# Patient Record
Sex: Male | Born: 1980 | Race: White | Hispanic: No | Marital: Married | Smoking: Former smoker
Health system: Southern US, Community
[De-identification: ages and names within clinical notes are randomized; demographics above are authoritative.]

---

## 2004-11-16 ENCOUNTER — Emergency Department: Payer: Self-pay | Admitting: Emergency Medicine

## 2007-03-14 ENCOUNTER — Emergency Department: Payer: Self-pay

## 2007-05-22 ENCOUNTER — Emergency Department: Payer: Self-pay | Admitting: Emergency Medicine

## 2008-02-12 ENCOUNTER — Emergency Department: Payer: Self-pay | Admitting: Emergency Medicine

## 2008-11-10 ENCOUNTER — Emergency Department: Payer: Self-pay | Admitting: Emergency Medicine

## 2009-08-05 ENCOUNTER — Emergency Department: Payer: Self-pay | Admitting: Emergency Medicine

## 2009-11-25 ENCOUNTER — Ambulatory Visit: Payer: Self-pay | Admitting: Family Medicine

## 2010-05-21 ENCOUNTER — Emergency Department: Payer: Self-pay | Admitting: Emergency Medicine

## 2010-05-22 ENCOUNTER — Emergency Department: Payer: Self-pay | Admitting: Emergency Medicine

## 2011-04-17 ENCOUNTER — Emergency Department: Payer: Self-pay | Admitting: Emergency Medicine

## 2011-08-26 IMAGING — CT CT ABD-PELV W/ CM
1 of 2 series · 15 of 32 positions shown, 19 images · non-contrast
Comparison: none

REASON FOR EXAM: (1) epigastric pain ? early a/p discussed w Dr. MINAYO.; (2)
epigastric pain, discuss
COMMENTS:   May transport without cardiac monitor

[Series 2: soft tissue · axial · 0.68mm/px · z∈[-1210,-790]mm · 15 of 154 slices shown, 19 images]
[im 7/154  soft-tissue]
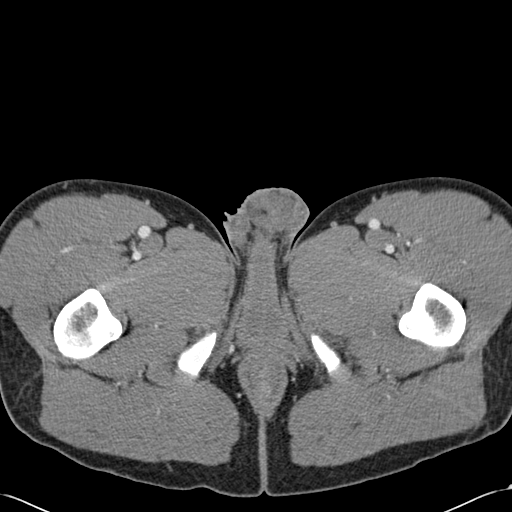
[im 7/154  bone]
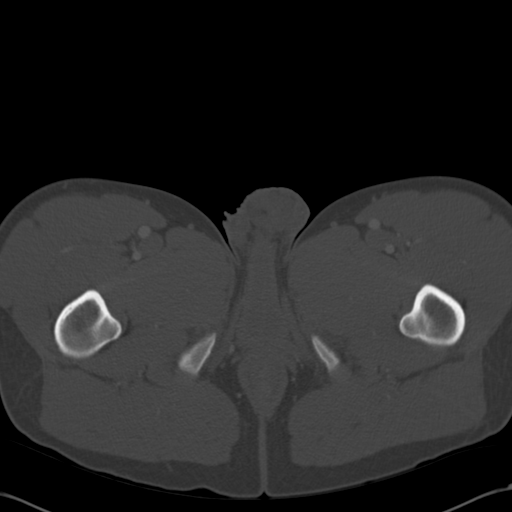
[im 19/154  soft-tissue]
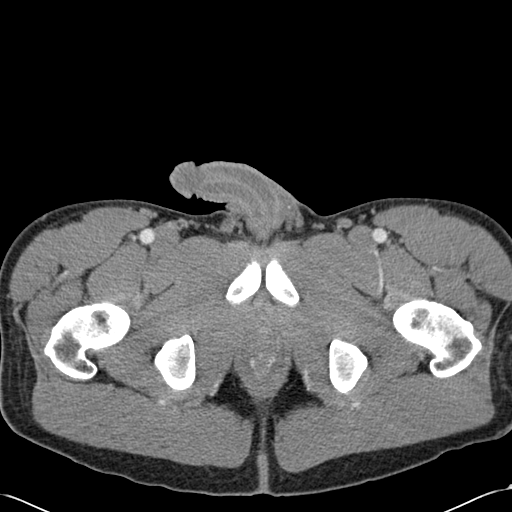
[im 31/154  soft-tissue]
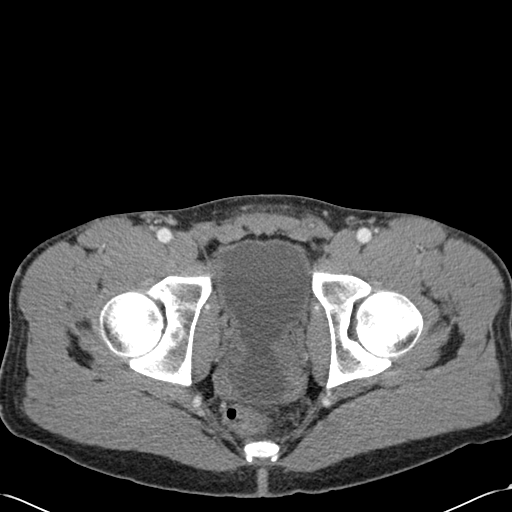
[im 43/154  soft-tissue]
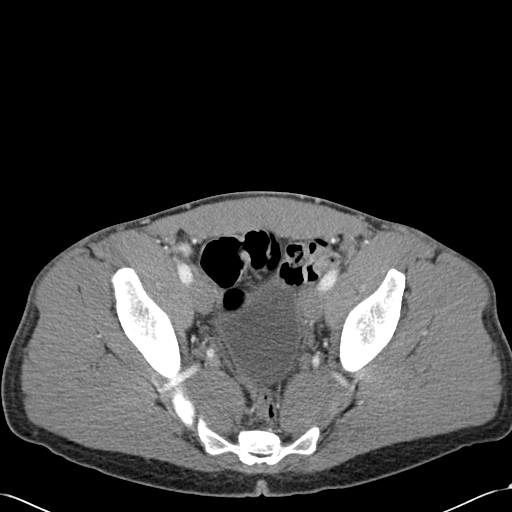
[im 56/154  soft-tissue]
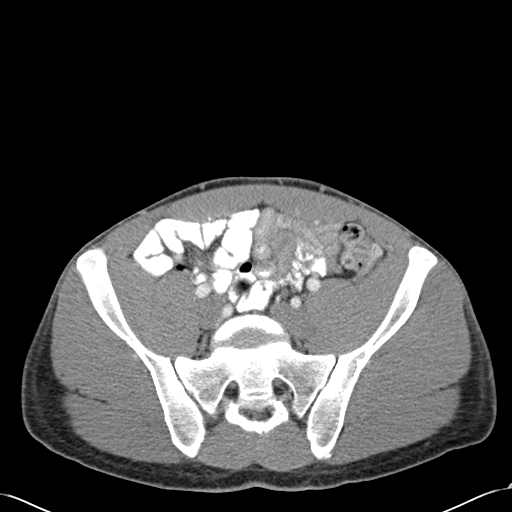
[im 68/154  soft-tissue]
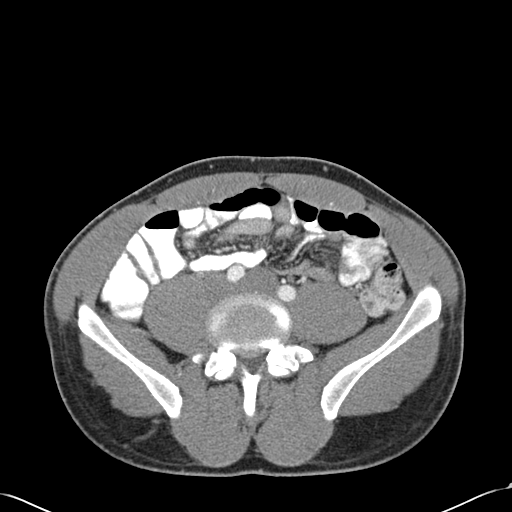
[im 80/154  soft-tissue]
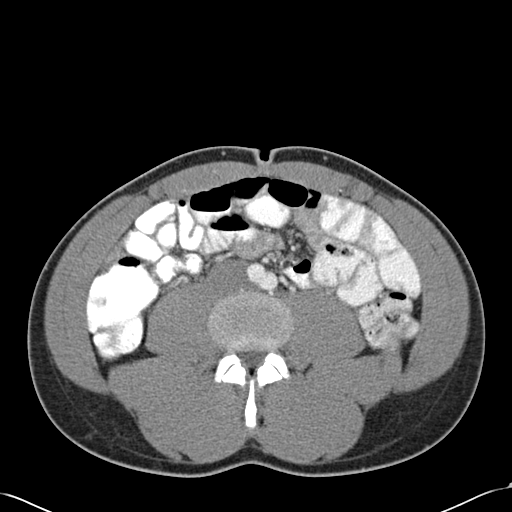
[im 86/154  soft-tissue]
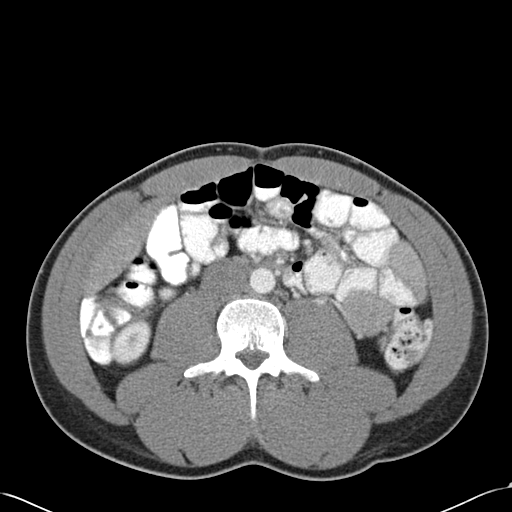
[im 98/154  soft-tissue]
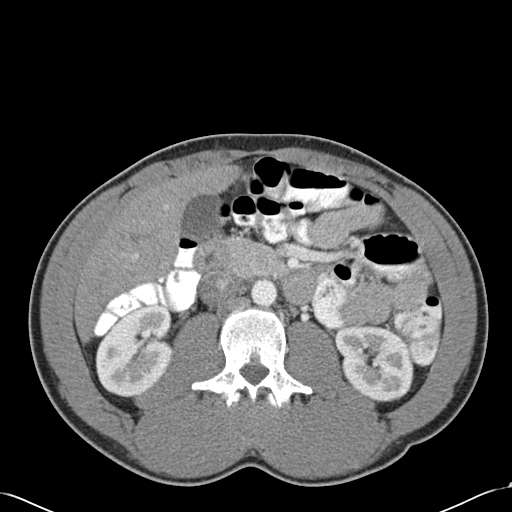
[im 98/154  bone]
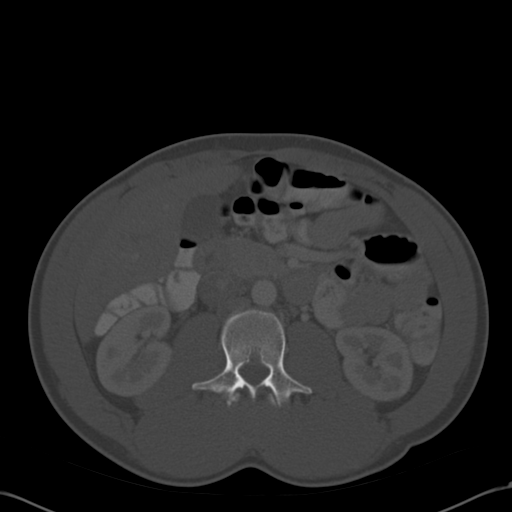
[im 111/154  soft-tissue]
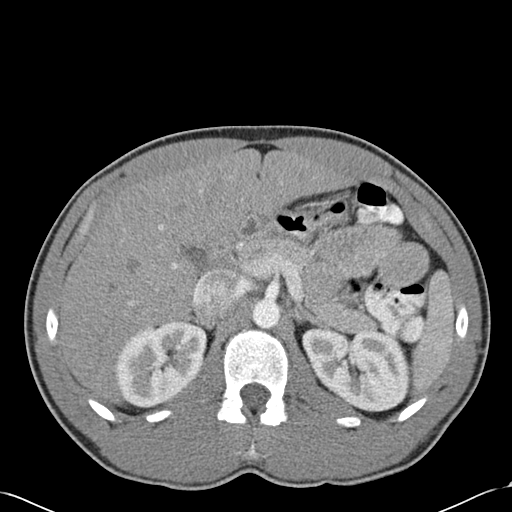
[im 123/154  soft-tissue]
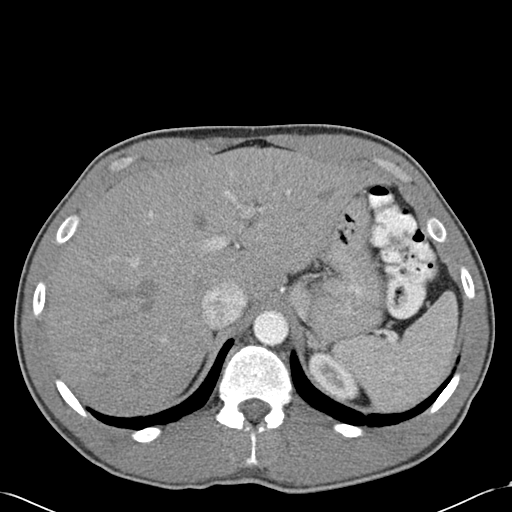
[im 129/154  lung]
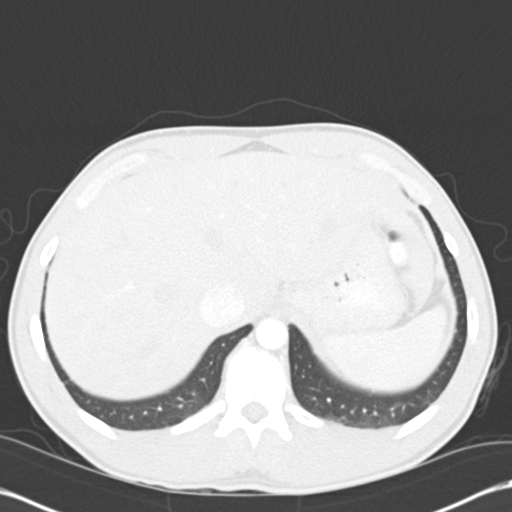
[im 135/154  soft-tissue]
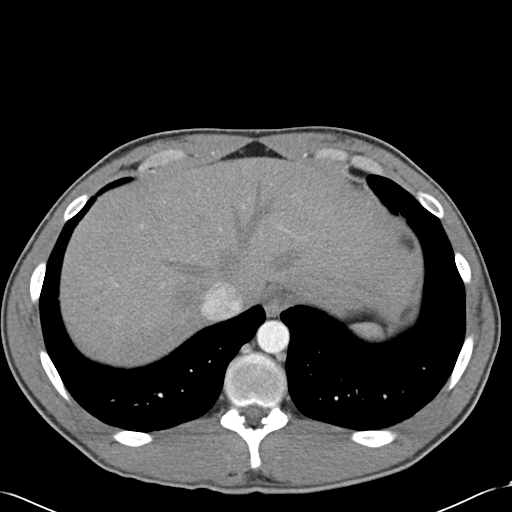
[im 135/154  lung]
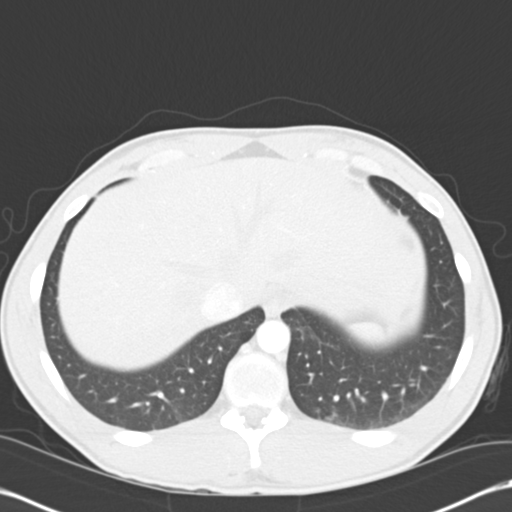
[im 141/154  lung]
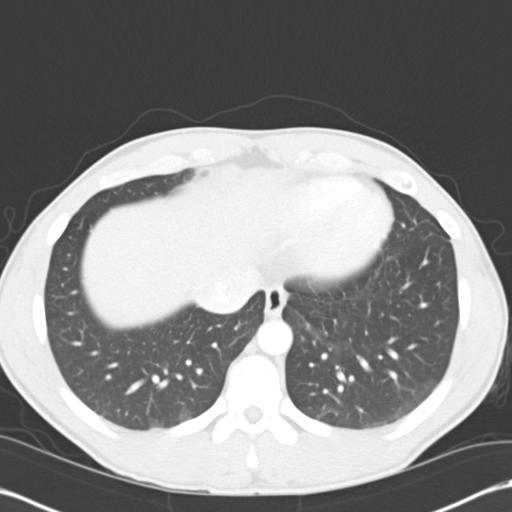
[im 147/154  soft-tissue]
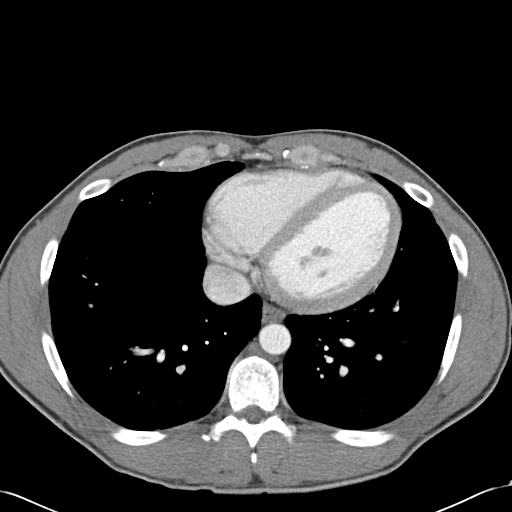
[im 147/154  lung]
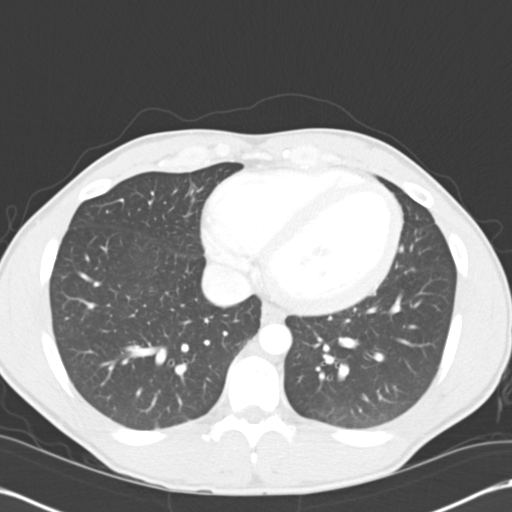

[15 of 32 positions shown; findings below may reference images not displayed]

PROCEDURE:     CT  - CT ABDOMEN / PELVIS  W  - May 21, 2010 [DATE]

RESULT:     CT of the abdomen and pelvis is performed utilizing 100 mL of
2sovue-6MP iodinated intravenous contrast and oral contrast. Images are
reconstructed in the axial plane at 3 mm slice thickness increments. The
patient has no previous CT of the abdomen and pelvis.

Images through the base of the lungs demonstrate normal-appearing aeration
with minimal dependent atelectasis present.

The liver, spleen, adrenal glands, gallbladder, pancreas, aorta, kidneys and
urinary bladder appear unremarkable. There is no evidence of abnormal bowel
distention or bowel wall thickening. The appendix is seen and contains air
and oral contrast and appears normal. There is no abscess evident. There is
no evidence of adenopathy.
IMPRESSION: Unremarkable CT of the abdomen and pelvis.

## 2011-08-27 IMAGING — CR DG ABDOMEN 3V
1 series · 5 of 5 positions shown · non-contrast
Comparison: none

REASON FOR EXAM: Pain
COMMENTS:   May transport without cardiac monitor

PROCEDURE:     DXR - DXR ABDOMEN 3-WAY (INCL PA CXR)  - May 22, 2010  [DATE]
RESULT:     Comparison is made to the study of 02/12/2008.
The lungs are clear. There is contrast through the colon to the rectum.
There is no abnormal bowel distention or free air. There is no abnormal
calcification.

[Series 1: view not recorded · 0.17mm/px · 5 of 5 slices shown]
[im 1/5]
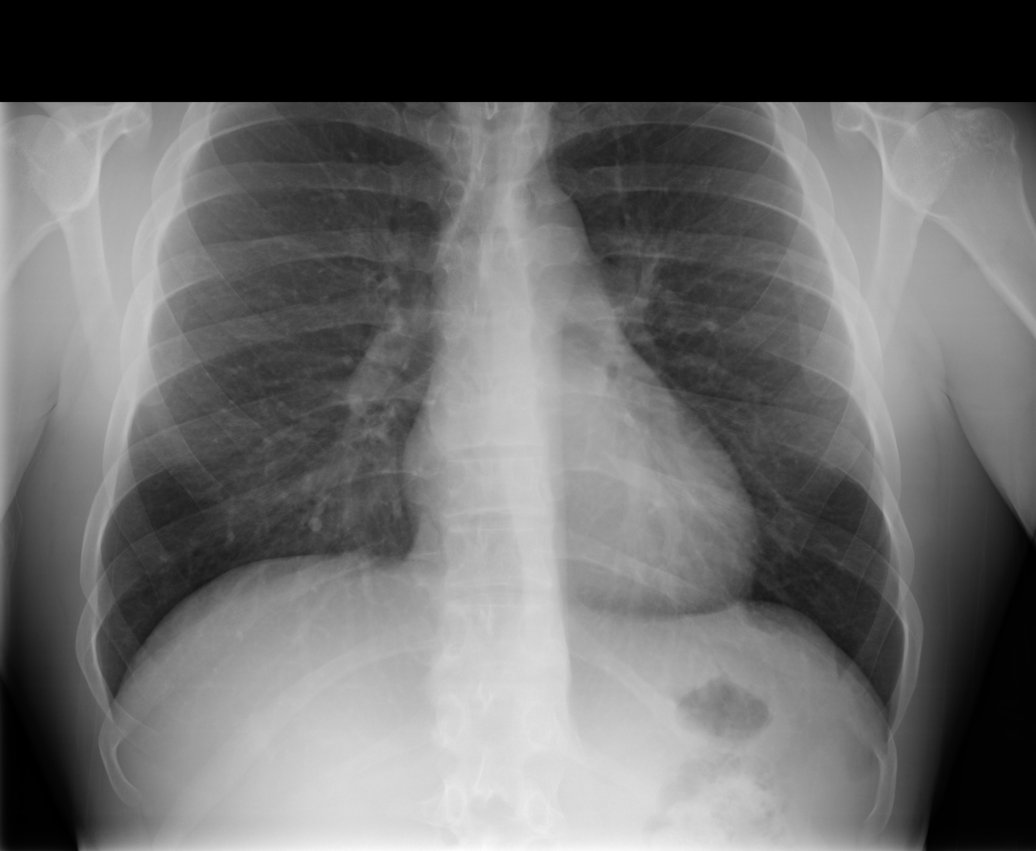
[im 2/5]
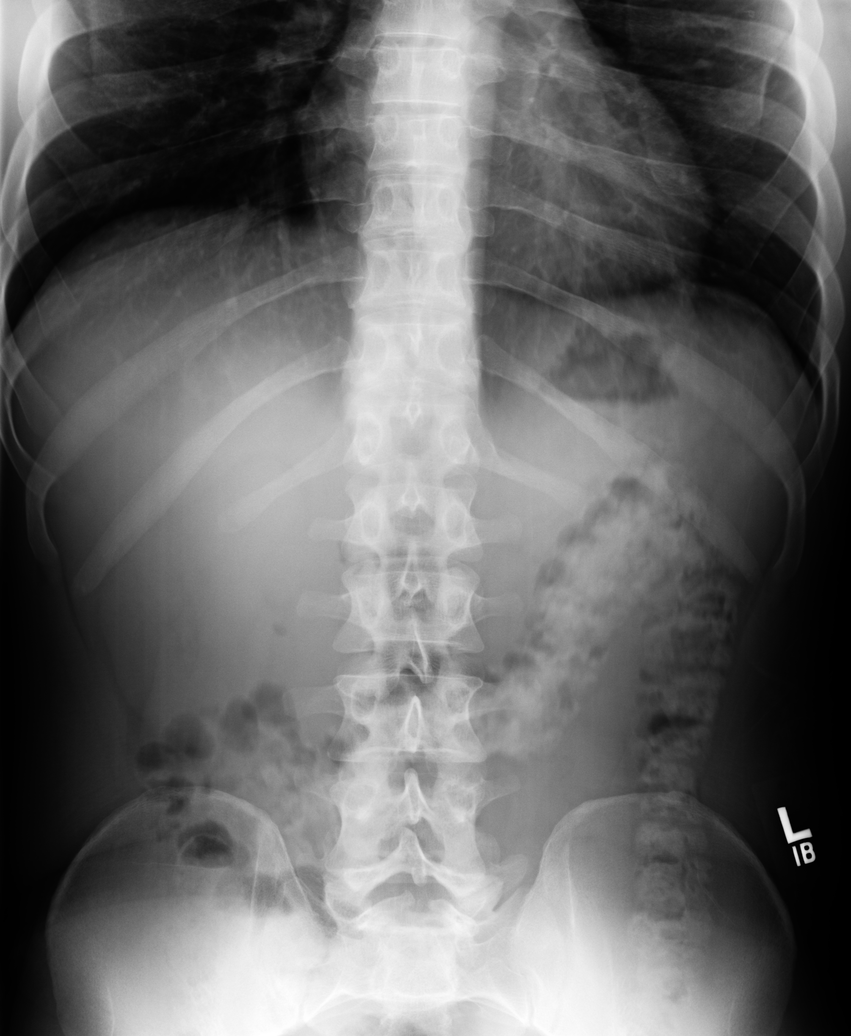
[im 3/5]
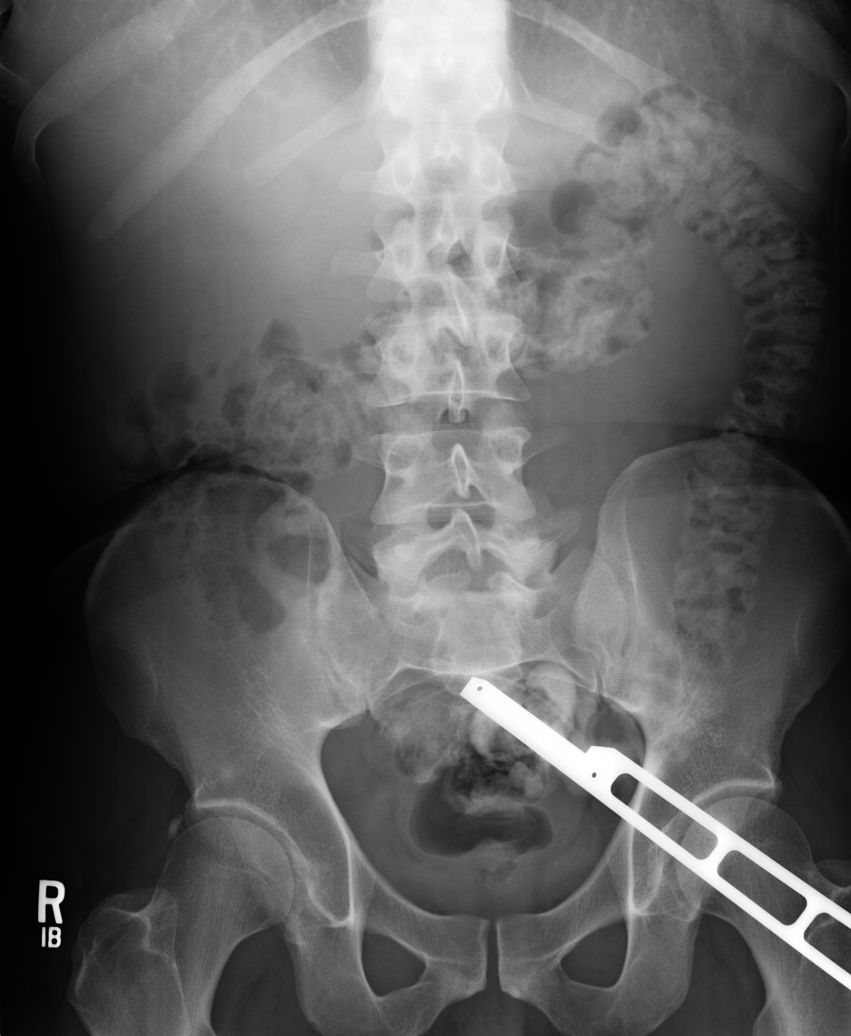
[im 4/5]
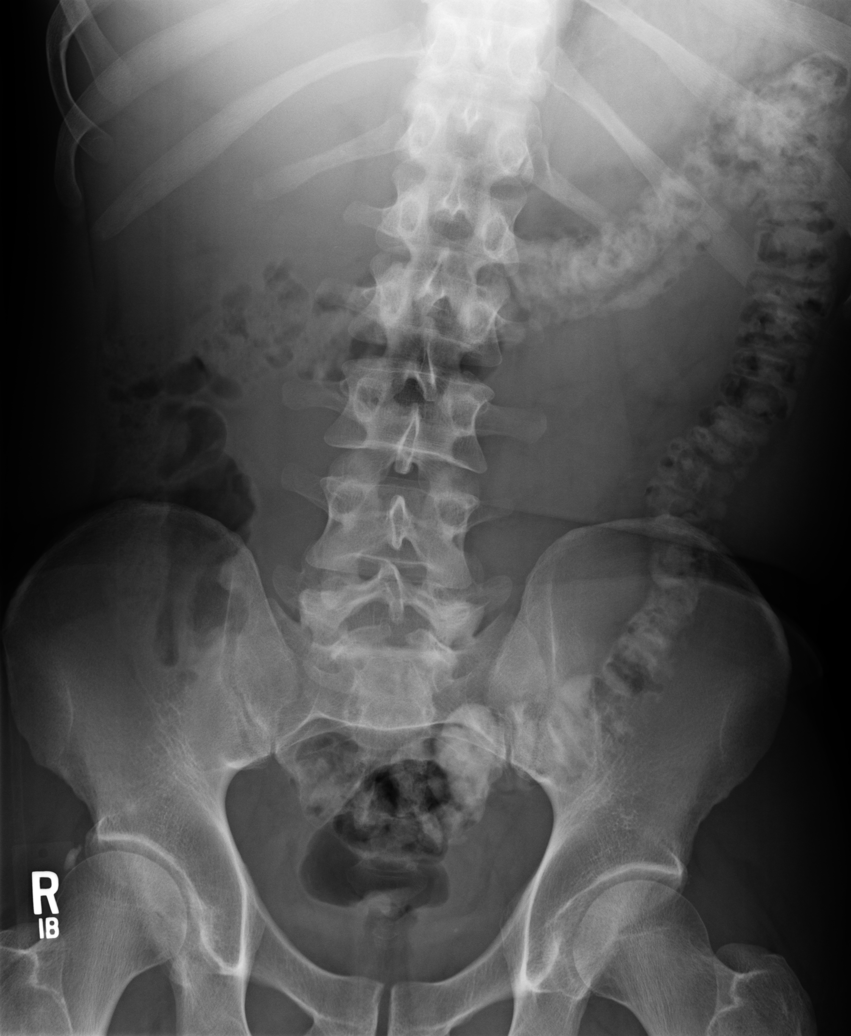
[im 5/5]
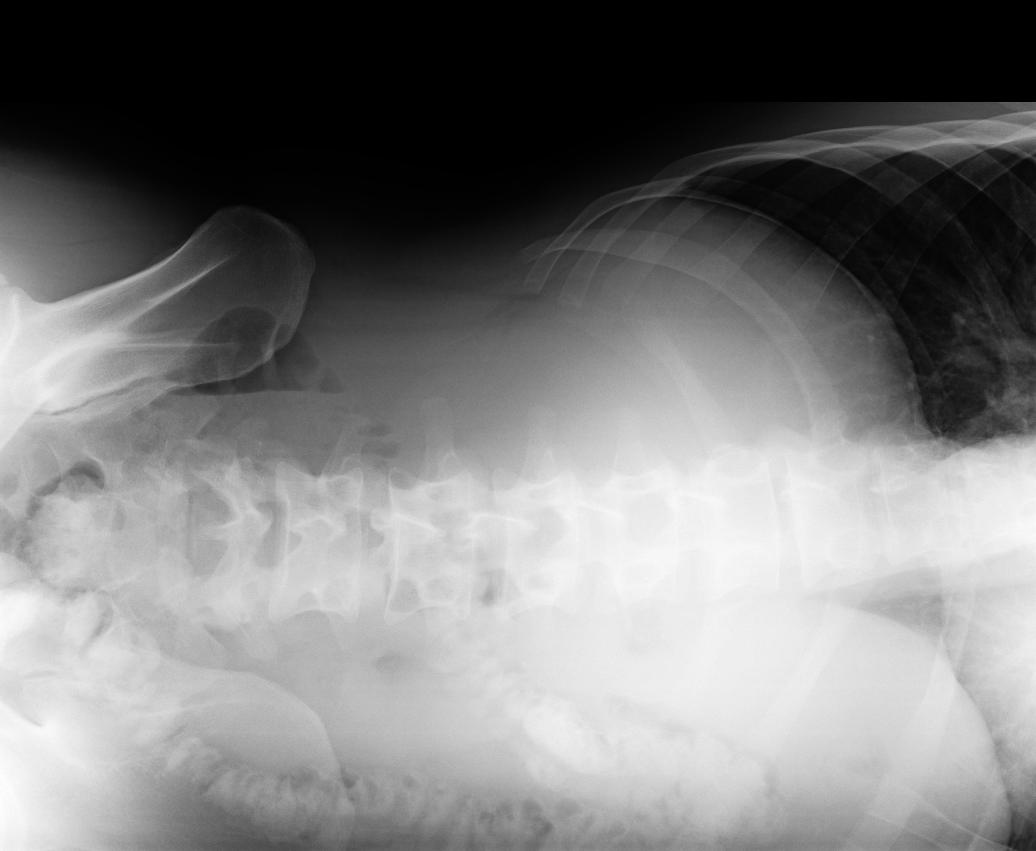

[5 of 5 positions shown; findings below may reference images not displayed]

IMPRESSION: Contrast present from the recent CT. No evidence of
obstruction or perforation.

## 2020-04-10 ENCOUNTER — Ambulatory Visit: Payer: BC Managed Care – PPO | Attending: Sports Medicine | Admitting: Physical Therapy

## 2020-04-10 ENCOUNTER — Other Ambulatory Visit: Payer: Self-pay

## 2020-04-10 ENCOUNTER — Encounter: Payer: Self-pay | Admitting: Physical Therapy

## 2020-04-10 DIAGNOSIS — M545 Low back pain: Secondary | ICD-10-CM | POA: Diagnosis not present

## 2020-04-10 DIAGNOSIS — M25551 Pain in right hip: Secondary | ICD-10-CM | POA: Diagnosis present

## 2020-04-10 DIAGNOSIS — M25552 Pain in left hip: Secondary | ICD-10-CM | POA: Diagnosis present

## 2020-04-10 DIAGNOSIS — G8929 Other chronic pain: Secondary | ICD-10-CM | POA: Diagnosis present

## 2020-04-11 ENCOUNTER — Encounter: Payer: Self-pay | Admitting: Physical Therapy

## 2020-04-11 NOTE — Therapy (Signed)
[facility] [address] [place], [place], [address] Phone: [phone]   Fax:  [phone]  Physical Therapy Evaluation  Patient Details  Name: Arthur Gonzalez MRN: [id number] Date of Birth: April 08, 1981 Referring Provider (PT): Dr. Landry Mellow   Encounter Date: 04/10/2020   PT End of Session - 04/11/20 0732    Visit Number 1    Number of Visits 9    Date for PT Re-Evaluation 05/08/20    Authorization - Visit Number 1    Authorization - Number of Visits 10    PT Start Time 1428    PT Stop Time 1536    PT Time Calculation (min) 68 min    Activity Tolerance Patient limited by pain    Behavior During Therapy [facility] for tasks assessed/performed           History reviewed. No pertinent past medical history.  History reviewed. No pertinent surgical history.  There were no vitals filed for this visit.    Subjective Assessment - 04/10/20 1604    Subjective Pt is a 39 y.o. male referred to PT for 2 year history of chronic LBP that is mid line. Pt denies N/t down BLE's. Pt reports pain is best in the morning when he wakes up at a 2-3 NPS. After long day at work pt reports 8-10/10 pain NPS. Pt has a history of manual labor jobs, being in [employer] and is currently in a very labor intensive job working for an [employer]. Pt has the most pain with rotation to the R/L when on a ladder but reports having to crawl under vehicles also on a daily basis. Pt reports a pulsing, dull achey pain with sitting in his low back that is intermittent. Pt also reports having hip pain R> L that referred to R testicle but reports in the last 4 days he is now having the same L hip pain that is referring to his L testicle now. Pt does report recent difficulty within the last 4 days having a need to take a bowel movement but has not been able. Also report frequent urination but no increase in fluid intake. Pt is tired of having LBP as it has become interferring with his  family life and performance of job. His goal with PT is to reduce pain and prevent a surgery and be able to perform work duties.    Limitations Sitting;Lifting;Standing;Walking    Patient Stated Goals Prevent surgery. Improve pain. Ability to keep working at current job.    Currently in Pain? Yes    Pain Score 6     Pain Location Back    Pain Orientation Right;Left;Medial    Pain Descriptors / Indicators Aching;Discomfort;Throbbing    Pain Type Chronic pain    Pain Radiating Towards B hips to groin. Into testicles.    Pain Onset More than a month ago    Pain Frequency Intermittent    Aggravating Factors  R/L rotation, prone extension, R side bending, R hip flexion    Pain Relieving Factors N/A. Standing lumbar extensions.    Effect of Pain on Daily Activities Affecting ability to work. Interfering with family life.    Multiple Pain Sites Yes    Pain Location Hip    Pain Orientation Right;Left    Pain Descriptors / Indicators Aching;Dull    Pain Type Chronic pain    Pain Onset More than a month ago  [facility] PT Assessment - 04/11/20 0001      Assessment   Medical Diagnosis Chronic low back pain and hip pain    Referring Provider (PT) Dr. Landry Mellow    Onset Date/Surgical Date 08/25/19    Hand Dominance Right    Prior Therapy Yes      Home Environment   Living Environment Private residence    Living Arrangements Spouse/significant other;Children      Prior Function   Level of Independence Independent      Cognition   Overall Cognitive Status Within Functional Limits for tasks assessed           OBJECTIVE  Mental Status Patient's fund of knowledge is within normal limits for educational level.  SENSATION: Grossly intact to light touch bilateral LEs as determined by testing dermatomes L2-S2 Proprioception and hot/cold testing deferred on this date   MUSCULOSKELETAL: Tremor: None Bulk: Normal Tone: Normal No visible step-off along spinal  column  Posture Lumbar lordosis: WNL Lumbar lateral shift: negative  Gait Antalgic gait on R.   Palpation TTP to B SIJ L>R. TTP to midline lumbar spine and B paraspinals. TTP B glut med musculature and R thoracic paraspinals at level of inferior angle of scapula.  Strength (out of 5) R/L 5*/5 Hip flexion 5/5 Hip abduction (Seated) 5/5 Hip adduction 5/5 Knee extension 5/5 Knee flexion 5/5 Ankle dorsiflexion 5*/5 Ankle plantarflexion  *Indicates pain   AROM (degrees) R/L (all movements include overpressure unless otherwise stated) Lumbar forward flexion (65): WFL Lumbar extension (30): WFL Lumbar lateral flexion (25): R: WFL L: WFL* Thoracic and Lumbar rotation (30 degrees):  R: WFL* L: WFL Hip Flexion (0-125): WFL *Indicates pain    Muscle Length Hamstrings: R: 90 degrees L: 90 degrees  [place]: WFL Bilaterally    Passive Accessory Intervertebral Motion (PAIVM) Pt reports concordant pain with CPA's from L1-L5. Hypomobile throughout. Thoracic CPA's T12-T1 hypomobile throughout. PA sacral mobs no increase in pain.   SPECIAL TESTS Lumbar Radiculopathy and Discogenic: Slump (SN 83, -LR 0.32): R: NEGATIVE L: NEGATIVE SLR (SN 92, -LR 0.29): R: NEGATIVE L:  NEGATIVE  (+) FADIR on R  SIJ:  TTP on B SIJ.  Sacral thrust: NEGATIVE Sacral Distraction: NEGATIVE Sacral compression: NEGATIVE  FOTO score: 53/69  Clinical Prediction Rule for Compression Fractures: 1. >52 y/o 2. no presence of leg pain 3. BMI <22 4. client not regularly exercising 5. male <1 of 5 = SN 97, - LR 0.16 >4 of 5 = SP 96, + LR 9.6  Clinical Prediction Rule for Spinal Stenosis: 1. Bilateral symptoms 2. Leg pain > back pain 3. Pain during walking or standing 4. Pain relief when sitting 5. Age >17 y/o <1 (+) finding = rule out stenosis (SN 96) 4 of 5 (+) findings = rule in stenosis (SP 98)  Clinical Prediction Rule for Facet Joint Syndrome (check pg 460): 1. Age > 43 y/o 2. Symptoms  best when walking 3. Symptoms best when sitting 4. Onset of pain is paraspinal 5. (+) lumbar extension-rotation test >3 (+) findings = SP 91, +LR 9.7 <2 (+) findings = SN 100  Pain Provocation Cluster for SIJ Dysfunction Thigh Thrust Test (SN 88, -LR 0.18) Gaenslen's Test Distraction Test Compression Test Sacral Thrust Test  3 (+) tests: if discogenic pain has been ruled out through repeated extensions; SN 94, -LR 0.80  Rule out Hip OA (SN 86) Hip pain Hip IR <15 degrees morning stiffness < 60 min > 50 y/o    Neurogenic  vs Vascular Claudication (Rieman, 2016)  Description Neurogenic Vascular  Quality of pain cramping Burning, cramping  LBP Frequently present Absent   Sensory symptoms Frequently present Absent  Muscle weakness Frequently present Absent  Reflex changes Frequently present Absent  Bicycle test Symptoms only when sitting upright Symptoms not position dependent  Arterial pulses Normal  Decreased or absent  Skin/dystrophic changes Absent  Frequently present  Aggravating factors Upright posture, extension of trunk Any leg activity  Relieving factors Sitting, bending forward Rest, no leg activity  Walking uphill Symptoms produced later Not position dependent  Walking downhill Symptoms produced earlier Not position dependent    Lumbar Spinal Stenosis vs Radiculopathy due to Disc Herniation (Rieman, 2016)  Description Spinal Stenosis Disc Herniation  Age Usually > 38 y/o Usually < 38 y/o  Onset Usually more insidious Usually more sudden  Position change: flexion Better  Worse  Position change: extension Worse Better  Focal muscle weakness Less common Can be common  Dural tension Less common Common  UMN or LMN involvement  Central stenosis: UMN Lateral foraminal stenosis: LMN LMN    Objective measurements completed on examination: See above findings.      PT Education - 04/11/20 0731    Education Details Monitor bowel and bladder symptoms. With worsening  symptoms, and increased pulsating in low back, to call doctor.  See HEP.    Person(s) Educated Patient    Methods Explanation;Tactile cues;Verbal cues;Handout    Comprehension Verbalized understanding;Returned demonstration               PT Long Term Goals - 04/11/20 0805      PT LONG TERM GOAL #1   Title Pt will improve FOTO score to 69 to display perceived improvements in functional mobility.    Baseline 8/18: 53    Time 4    Period Weeks    Status New    Target Date 05/08/20      PT LONG TERM GOAL #2   Title Pt will report pain < 4/10 NPS after a work day to display improved ability to perform job tasks.    Baseline 8/18: 8-10/10 pain NPS after work day.    Time 4    Period Weeks    Status New    Target Date 05/08/20      PT LONG TERM GOAL #3   Title Pt will be able to complete 45 min drive with proper posture in his truck to improve lumbar pain    Baseline 8/18: LBP with driving up to 1/616/10 NPS    Time 4    Period Weeks    Status New    Target Date 05/08/20              Plan - 04/11/20 0735    Clinical Impression Statement Pt is a 39 y.o. male referred to PT for chronic midline LBP and B hip pain R > L. Pt has full lumbar AROM with pain with R rotation and L side bending. Pt has full BLE AROM. Pt has full BLE strength in sitting, with increase in R hip and R LBP with resisted R hip flexion. Pt also reports increased LBP on R with resisted R plantarflexion. Pt displays adequate B hamstring flexibilty. Pt is TTP at B SIJ, B lumbar paraspinals and thoracic paraspinals. Hypomobile CPA's L1-T8 with increase in LBP during. Pt displays hyopmobility in B hip AP mobs with no inmprovement in symptoms. Reports of "balloon like" feeling with R hip AP mobs and  R hip long arc distraction. Increase in LBP with prone press-ups and glut bridges however, no increased pain with standing lumbar extensions and lumbar trunk rotations. Concern with reports of pulsing in low back, and also  bowel and bladder and also B referred pain pattern to B testicles. Pt has signs and symptoms that indicate possible pelvic floor dysfunction with hip referral patterns and bowel/bladder symptoms along with hypomobility in lumbar spine. Pt educated on tracking pulsing in low back and also to contact physician in bowel and bladder symptoms worsen or cause increased pain/discomfort. If red flag symptoms persist, this would indicate need for MRI to rule out non-MSK pain. Pt can continue to benefit from skilled PT treatment to improve lumbar spine and hip pain to improve functional mobility.    Personal Factors and Comorbidities Comorbidity 1;Time since onset of injury/illness/exacerbation    Examination-Activity Limitations Bend;Locomotion Level;Lift    Examination-Participation Restrictions Occupation    Stability/Clinical Decision Making Evolving/Moderate complexity    Clinical Decision Making High    Rehab Potential Fair    PT Frequency 2x / week    PT Duration 4 weeks    PT Treatment/Interventions Electrical Stimulation;Moist Heat;Traction;Functional mobility training;Therapeutic activities;Therapeutic exercise;Neuromuscular re-education;Patient/family education;Manual techniques;ADLs/Self Care Home Management;Dry needling    PT Next Visit Plan Manual therapy, breathing exercises, iliac crest mobs.    PT Home Exercise Plan Lumbar trunk rotations, standing lumbar extensions, supine piriformis/glut stretch,    Recommended Other Services Possible referral to pelvic health. MRI if bowel/bladder symptoms persist.    Consulted and Agree with Plan of Care Patient           Patient will benefit from skilled therapeutic intervention in order to improve the following deficits and impairments:  Hypomobility, Decreased mobility, Increased muscle spasms, Pain  Visit Diagnosis: Chronic midline low back pain without sciatica  Bilateral hip pain     Problem List There are no problems to display for  this patient.  Cammie Kimery, PT, DPT # [id number], SPT 04/11/2020, 1:03 PM  [facility] [address] [place], [place], [address] Phone: [phone]   Fax:  [phone]  Name: MUNACHIMSO PALIN MRN: [id number] Date of Birth: March 05, 1981

## 2020-04-16 ENCOUNTER — Other Ambulatory Visit: Payer: Self-pay

## 2020-04-16 ENCOUNTER — Encounter: Payer: Self-pay | Admitting: Physical Therapy

## 2020-04-16 ENCOUNTER — Ambulatory Visit: Payer: BC Managed Care – PPO | Admitting: Physical Therapy

## 2020-04-16 DIAGNOSIS — M25551 Pain in right hip: Secondary | ICD-10-CM

## 2020-04-16 DIAGNOSIS — M545 Low back pain: Secondary | ICD-10-CM | POA: Diagnosis not present

## 2020-04-16 DIAGNOSIS — G8929 Other chronic pain: Secondary | ICD-10-CM

## 2020-04-16 NOTE — Therapy (Signed)
[facility] [address] [place], [place], [address] Phone: [phone]   Fax:  [phone]  Physical Therapy Treatment  Patient Details  Name: Arthur Gonzalez MRN: [id number] Date of Birth: 1981-02-11 Referring Provider (PT): Dr. Landry Mellow   Encounter Date: 04/16/2020   PT End of Session - 04/16/20 1711    Visit Number 2    Number of Visits 9    Date for PT Re-Evaluation 05/08/20    Authorization - Visit Number 2    Authorization - Number of Visits 10    PT Start Time 1612    PT Stop Time 1705    PT Time Calculation (min) 53 min    Activity Tolerance Patient limited by pain    Behavior During Therapy [facility] for tasks assessed/performed           History reviewed. No pertinent past medical history.  History reviewed. No pertinent surgical history.  There were no vitals filed for this visit.   Subjective Assessment - 04/16/20 1707    Subjective Pt reports improvement in pain over the weekend to only 2/10 NPS in low back. Pt reports being able to have a BM since previous session with reports of feeling increased urge but after sitting for 15 min can not go. Pt reports once returning to work on [employer], he had 8/10 pain in R/L low back near SIJ. Has been performing HEP and feels like standing lumbar extensions have "helped some".    Limitations Sitting;Lifting;Standing;Walking    Patient Stated Goals Prevent surgery. Improve pain. Ability to keep working at current job.    Currently in Pain? Yes    Pain Score 5     Pain Location Back    Pain Orientation Right;Left;Medial    Pain Descriptors / Indicators Aching;Discomfort    Pain Type Chronic pain    Pain Onset More than a month ago    Pain Onset More than a month ago          Pain pre treatment: 5/10 NPS  Neuromuscular Re-ed: First 10 min of treatment spent on pelvic floor musculature,  education on breathing and seated postural education on improving bowel movements with  reduced straining.  Supine, 1x10 post pelvic tilts. Pain with ant tilts so not performed. (performed after manual interventions for improved postural awareness and pain modulation)  Manual Therapy:   With pt in prone, and pillow under ant pelvis and feet, Hypervolt used along mid thoracic region B paraspinals to reduce muscular tension and pain. 10 min.  With pt in prone, and pillow lay out as above, 1x8, 20 sec bouts grade 2 PA sacral mobs/nutation sacral mobs/counternutation sacral mobs for pain relief.   With pt in R side-lying, L innominate post rotation: x10 grade 2 mobs for 10 sec bouts. To improve rotation to more neutral position.  Pt in R side-lying, L innominate distraction 2x30 sec for pain relief.   Pt educated on tracking symptoms while at work until next session. Use of diaphragmatic breathing while at work to reduce breath holding to decreased SNS activation.     Pain post treatment: 2/10 NPS      PT Education - 04/16/20 1710    Education Details Education on use of squatty potty to improve BM's. Education on pelvic floor muscles and how they can affect bowel and bladder function.    Person(s) Educated Patient    Methods Explanation;Demonstration;Handout    Comprehension Verbalized understanding  PT Long Term Goals - 04/11/20 0805      PT LONG TERM GOAL #1   Title Pt will improve FOTO score to 69 to display perceived improvements in functional mobility.    Baseline 8/18: 53    Time 4    Period Weeks    Status New    Target Date 05/08/20      PT LONG TERM GOAL #2   Title Pt will report pain < 4/10 NPS after a work day to display improved ability to perform job tasks.    Baseline 8/18: 8-10/10 pain NPS after work day.    Time 4    Period Weeks    Status New    Target Date 05/08/20      PT LONG TERM GOAL #3   Title Pt will be able to complete 45 min drive with proper posture in his truck to improve lumbar pain    Baseline 8/18: LBP with  driving up to 6/96 NPS    Time 4    Period Weeks    Status New    Target Date 05/08/20                 Plan - 04/16/20 1711    Clinical Impression Statement Pelvic rotation assessed prior to session. Noted L innominate rotated posteriorly and R innominate ant rotated.Prior to treatment pt had 5/10 NPS in low back L>R. Pt responded to manual therapy to B thoracic paraspinals, sacral mobilizations and L iliac crets post rotation mobs with a decrease in pt's pain to 2/10 NPS. Education provided on pelvic floor involvement for bowel and bladder dysfunction and diaphragmatic breathing for relaxing pelvic floor and desensitizing SNS. Pt can benefit from further skilled PT treatment to reduce pain in B low back/pelvis and to improve functional mobility so pt can work with less pain.    Personal Factors and Comorbidities Comorbidity 1;Time since onset of injury/illness/exacerbation    Examination-Activity Limitations Bend;Locomotion Level;Lift    Examination-Participation Restrictions Occupation    Stability/Clinical Decision Making Evolving/Moderate complexity    Rehab Potential Fair    PT Frequency 2x / week    PT Duration 4 weeks    PT Treatment/Interventions Electrical Stimulation;Moist Heat;Traction;Functional mobility training;Therapeutic activities;Therapeutic exercise;Neuromuscular re-education;Patient/family education;Manual techniques;ADLs/Self Care Home Management;Dry needling    PT Next Visit Plan Manual therapy, breathing exercises, iliac crest mobs.    PT Home Exercise Plan Lumbar trunk rotations, standing lumbar extensions, supine piriformis/glut stretch,    Consulted and Agree with Plan of Care Patient           Patient will benefit from skilled therapeutic intervention in order to improve the following deficits and impairments:  Hypomobility, Decreased mobility, Increased muscle spasms, Pain  Visit Diagnosis: Chronic midline low back pain without sciatica  Bilateral hip  pain     Problem List There are no problems to display for this patient.   Ronnie Derby, SPT  This entire session was performed under direct supervision and direction of a licensed Estate agent . I have personally read, edited and approve of the note as written. Sheria Lang PT, DPT [phone]  04/16/2020, 5:16 PM  [facility] [address] [place], [place], [address] Phone: [phone]   Fax:  [phone]  Name: AILTON VALLEY MRN: [id number] Date of Birth: 25-Sep-1980

## 2020-04-18 ENCOUNTER — Ambulatory Visit: Payer: BC Managed Care – PPO | Admitting: Physical Therapy

## 2020-04-18 ENCOUNTER — Other Ambulatory Visit: Payer: Self-pay

## 2020-04-18 ENCOUNTER — Encounter: Payer: Self-pay | Admitting: Physical Therapy

## 2020-04-18 ENCOUNTER — Encounter: Payer: BC Managed Care – PPO | Admitting: Physical Therapy

## 2020-04-18 DIAGNOSIS — M545 Low back pain, unspecified: Secondary | ICD-10-CM

## 2020-04-18 DIAGNOSIS — M25552 Pain in left hip: Secondary | ICD-10-CM

## 2020-04-18 NOTE — Therapy (Signed)
[facility] [address] [place], [place], [address] Phone: [phone]   Fax:  [phone]  Physical Therapy Treatment  Patient Details  Name: DJIBRIL GLOGOWSKI MRN: [id number] Date of Birth: 03-Apr-1981 Referring Provider (PT): Dr. Landry Mellow   Encounter Date: 04/18/2020   PT End of Session - 04/18/20 1838    Visit Number 3    Number of Visits 9    Date for PT Re-Evaluation 05/08/20    Authorization - Visit Number 3    Authorization - Number of Visits 10    PT Start Time 1652    PT Stop Time 1735    PT Time Calculation (min) 43 min    Activity Tolerance Patient limited by pain    Behavior During Therapy [facility] for tasks assessed/performed           History reviewed. No pertinent past medical history.  History reviewed. No pertinent surgical history.  There were no vitals filed for this visit.   Subjective Assessment - 04/18/20 1837    Subjective Pt reports pain relief carryover from previous session until the next day. Able to have more consistent bowel movements but squatty potty caused R foot to "go numb". Reports of 8/10 NPS LBP after work today.    Limitations Sitting;Lifting;Standing;Walking    Patient Stated Goals Prevent surgery. Improve pain. Ability to keep working at current job.    Currently in Pain? Yes    Pain Score 8     Pain Location Back    Pain Orientation Right;Left;Lower    Pain Descriptors / Indicators Sharp;Discomfort;Aching    Pain Type Chronic pain    Pain Onset More than a month ago    Pain Onset More than a month ago         Pain pre treatment: 8/10 NPS  Manual Therapy:   With pt in prone, and pillow under ant pelvis and feet, Hypervolt used along mid thoracic/lumbar paraspinals, glute region to reduce muscular tension and pain. Hot pack placed on low back for further pain relief. 30 min.  With pt in R side-lying, L innominate post rotation: x10 grade 2 mobs for 10 sec bouts. To improve rotation to  more neutral position.  Pt in R side-lying, L innominate distraction 2x30 sec for pain relief.   Pt unable to tolerate many motions or exercise due to significant pain in B low back. Attempting pain modulation techniques to decrease pain so pt can perform movement/exercises.   Pain post treatment: 4/10 NPS. However upon standing pt reported 6/10 NPS walking out of clinic.      PT Long Term Goals - 04/11/20 0805      PT LONG TERM GOAL #1   Title Pt will improve FOTO score to 69 to display perceived improvements in functional mobility.    Baseline 8/18: 53    Time 4    Period Weeks    Status New    Target Date 05/08/20      PT LONG TERM GOAL #2   Title Pt will report pain < 4/10 NPS after a work day to display improved ability to perform job tasks.    Baseline 8/18: 8-10/10 pain NPS after work day.    Time 4    Period Weeks    Status New    Target Date 05/08/20      PT LONG TERM GOAL #3   Title Pt will be able to complete 45 min drive with proper posture in  his truck to improve lumbar pain    Baseline 8/18: LBP with driving up to 2/67 NPS    Time 4    Period Weeks    Status New    Target Date 05/08/20                 Plan - 04/18/20 1839    Clinical Impression Statement Due to increase in pt's pain manual therapy and heat modality used to decrease symptoms. Pt did not have any pain relief until pt in prone with hot pack on low back and hypervolt massage device applied to B lumbar and thoracic paraspinals. Decrease in pain to 4/10 NPS after manual; with standing pt reports 6/10 NPS. Pt limited by pain and irritability during today's session, but was able to achieve a clinically significant reduction in pain from manual therapy despite high irritability. Patient will continue to benefit from skilled therapuetic intervention to address deficits in pain, posture, and activity tolerance for improved function and overall QOL.    Personal Factors and Comorbidities Comorbidity  1;Time since onset of injury/illness/exacerbation    Examination-Activity Limitations Bend;Locomotion Level;Lift    Examination-Participation Restrictions Occupation    Stability/Clinical Decision Making Evolving/Moderate complexity    Rehab Potential Fair    PT Frequency 2x / week    PT Duration 4 weeks    PT Treatment/Interventions Electrical Stimulation;Moist Heat;Traction;Functional mobility training;Therapeutic activities;Therapeutic exercise;Neuromuscular re-education;Patient/family education;Manual techniques;ADLs/Self Care Home Management;Dry needling    PT Next Visit Plan Manual therapy, breathing exercises, iliac crest mobs.    PT Home Exercise Plan Lumbar trunk rotations, standing lumbar extensions, supine piriformis/glut stretch,    Consulted and Agree with Plan of Care Patient           Patient will benefit from skilled therapeutic intervention in order to improve the following deficits and impairments:  Hypomobility, Decreased mobility, Increased muscle spasms, Pain  Visit Diagnosis: Chronic midline low back pain without sciatica  Bilateral hip pain     Problem List There are no problems to display for this patient.   Ronnie Derby, SPT  This entire session was performed under direct supervision and direction of a licensed Estate agent . I have personally read, edited and approve of the note as written.  Sheria Lang PT, DPT [phone]  04/18/2020, 6:58 PM  [facility] [address] [place], [place], [address] Phone: [phone]   Fax:  [phone]  Name: CASPAR FAVILA MRN: [id number] Date of Birth: 1980-11-02

## 2020-04-23 ENCOUNTER — Other Ambulatory Visit: Payer: Self-pay

## 2020-04-23 ENCOUNTER — Encounter: Payer: Self-pay | Admitting: Physical Therapy

## 2020-04-23 ENCOUNTER — Encounter: Payer: BC Managed Care – PPO | Admitting: Physical Therapy

## 2020-04-23 ENCOUNTER — Ambulatory Visit: Payer: BC Managed Care – PPO | Admitting: Physical Therapy

## 2020-04-23 DIAGNOSIS — G8929 Other chronic pain: Secondary | ICD-10-CM

## 2020-04-23 DIAGNOSIS — M545 Low back pain: Secondary | ICD-10-CM | POA: Diagnosis not present

## 2020-04-23 DIAGNOSIS — M25552 Pain in left hip: Secondary | ICD-10-CM

## 2020-04-23 NOTE — Therapy (Signed)
[facility] [address] [place], [place], [address] Phone: [phone]   Fax:  [phone]  Physical Therapy Treatment  Patient Details  Name: Arthur Gonzalez MRN: [id number] Date of Birth: 02/28/1981 Referring Provider (PT): Dr. Landry Mellow   Encounter Date: 04/23/2020   PT End of Session - 04/23/20 1807    Visit Number 4    Number of Visits 9    Date for PT Re-Evaluation 05/08/20    Authorization - Visit Number 4    Authorization - Number of Visits 10    PT Start Time 1629    PT Stop Time 1714    PT Time Calculation (min) 45 min    Activity Tolerance Patient limited by pain    Behavior During Therapy [facility] for tasks assessed/performed           History reviewed. No pertinent past medical history.  History reviewed. No pertinent surgical history.  There were no vitals filed for this visit.   Subjective Assessment - 04/23/20 1805    Subjective Pt reports improvement in urination and bowel function since PT began with breathing exericse. Pt reports he now knows during urination that he has fully emptied bladder where prior to PT he reports peeing frequently with not a lot of volume and remained urge to urinate without being able to perform. Pt reports pain is no longer referring to his B hips and pelvis to testicles and current LBP medial along spine at 4/10 NPS.    Limitations Sitting;Lifting;Standing;Walking    Patient Stated Goals Prevent surgery. Improve pain. Ability to keep working at current job.    Currently in Pain? Yes    Pain Score 4     Pain Location Back    Pain Orientation Right;Left;Lower;Medial    Pain Descriptors / Indicators Aching;Discomfort    Pain Type Chronic pain    Pain Onset More than a month ago    Pain Frequency Intermittent    Multiple Pain Sites No    Pain Onset More than a month ago          There.ex:  8 min on Nu-Step with UE's/LE's. Hot pack on lumbar spine to decrease pain. Discussion of  improvement in bowel and bladder symptoms during.   Manual Therapy:   With pt in prone, one 30 mmx60 mm needle placed in R L5 During this time pt began reporting signs and symptoms of sympathetic nervous system response. After 10 min of being seated with ice pack on neck and cold water, pt returned to baseline and symptom free. Refer to clinical impression for details. Less TTP after dry needling on R L5 multifidus.    PT Long Term Goals - 04/11/20 0805      PT LONG TERM GOAL #1   Title Pt will improve FOTO score to 69 to display perceived improvements in functional mobility.    Baseline 8/18: 53    Time 4    Period Weeks    Status New    Target Date 05/08/20      PT LONG TERM GOAL #2   Title Pt will report pain < 4/10 NPS after a work day to display improved ability to perform job tasks.    Baseline 8/18: 8-10/10 pain NPS after work day.    Time 4    Period Weeks    Status New    Target Date 05/08/20      PT LONG TERM GOAL #3   Title Pt  will be able to complete 45 min drive with proper posture in his truck to improve lumbar pain    Baseline 8/18: LBP with driving up to 0/53 NPS    Time 4    Period Weeks    Status New    Target Date 05/08/20                 Plan - 04/23/20 1809    Clinical Impression Statement Due to reported, localized pain concordant with palpation, pt educated on risks and benefits of dry needling therapy. Pt verbalized consent to treatment after risks and benefits explained. After 1 28mmx60 mm needle placed in R L5 multifidi, pt displayed symptoms of a sympathetic nervous system response with nausea, cold sweats, hyperventilation and shakiness. After needle removed, Pt sat up denying dizsiness. Cold pack placed on neck and cold water given. Pt's HR in the 76-79 BPM region. After 5 min of rest, pt displayed normalized respiratory rate and HR at 64 BPM. Pt reported reduced pain and symptoms in R Low back with no longer TTP after dry needling. Pt educated  to monitor his LBP symptoms after dry needling.    Personal Factors and Comorbidities Comorbidity 1;Time since onset of injury/illness/exacerbation    Examination-Activity Limitations Bend;Locomotion Level;Lift    Examination-Participation Restrictions Occupation    Stability/Clinical Decision Making Evolving/Moderate complexity    Rehab Potential Fair    PT Frequency 2x / week    PT Duration 4 weeks    PT Treatment/Interventions Electrical Stimulation;Moist Heat;Traction;Functional mobility training;Therapeutic activities;Therapeutic exercise;Neuromuscular re-education;Patient/family education;Manual techniques;ADLs/Self Care Home Management;Dry needling    PT Next Visit Plan Prone manual therapy, graded exercise    PT Home Exercise Plan Lumbar trunk rotations, standing lumbar extensions, supine piriformis/glut stretch,    Consulted and Agree with Plan of Care Patient           Patient will benefit from skilled therapeutic intervention in order to improve the following deficits and impairments:  Hypomobility, Decreased mobility, Increased muscle spasms, Pain  Visit Diagnosis: Chronic midline low back pain without sciatica  Bilateral hip pain     Problem List There are no problems to display for this patient.  Cammie Benge, PT, DPT # [id number] SPT 04/23/2020, 6:28 PM  [facility] [address] [place], [place], [address] Phone: [phone]   Fax:  [phone]  Name: DEACON GADBOIS MRN: [id number] Date of Birth: 05-Mar-1981

## 2020-04-25 ENCOUNTER — Other Ambulatory Visit: Payer: Self-pay

## 2020-04-25 ENCOUNTER — Ambulatory Visit: Payer: BC Managed Care – PPO | Attending: Sports Medicine | Admitting: Physical Therapy

## 2020-04-25 ENCOUNTER — Encounter: Payer: Self-pay | Admitting: Physical Therapy

## 2020-04-25 ENCOUNTER — Encounter: Payer: BC Managed Care – PPO | Admitting: Physical Therapy

## 2020-04-25 DIAGNOSIS — M545 Low back pain: Secondary | ICD-10-CM | POA: Diagnosis present

## 2020-04-25 DIAGNOSIS — M25551 Pain in right hip: Secondary | ICD-10-CM | POA: Diagnosis present

## 2020-04-25 DIAGNOSIS — M25552 Pain in left hip: Secondary | ICD-10-CM | POA: Diagnosis present

## 2020-04-25 DIAGNOSIS — G8929 Other chronic pain: Secondary | ICD-10-CM | POA: Diagnosis present

## 2020-04-25 NOTE — Therapy (Signed)
[facility] [address] [place], [place], [address] Phone: [phone]   Fax:  [phone]  Physical Therapy Treatment  Patient Details  Name: Arthur Gonzalez MRN: [id number] Date of Birth: 07/24/1981 Referring Provider (PT): Dr. Landry Mellow   Encounter Date: 04/25/2020   PT End of Session - 04/25/20 1750    Visit Number 5    Number of Visits 9    Date for PT Re-Evaluation 05/08/20    Authorization - Visit Number 5    Authorization - Number of Visits 10    PT Start Time 1702    PT Stop Time 1747    PT Time Calculation (min) 45 min    Activity Tolerance Patient limited by pain    Behavior During Therapy [facility] for tasks assessed/performed           History reviewed. No pertinent past medical history.  History reviewed. No pertinent surgical history.  There were no vitals filed for this visit.   Subjective Assessment - 04/25/20 1748    Subjective Pt reports 2/10 NPS in low back. Pt reports "taking it easy" over the last couple days at work to relieve back pain.    Limitations Sitting;Lifting;Standing;Walking    Patient Stated Goals Prevent surgery. Improve pain. Ability to keep working at current job.    Currently in Pain? Yes    Pain Score 2     Pain Location Back    Pain Orientation Right;Left;Lower    Pain Onset More than a month ago    Pain Onset More than a month ago           There.ex:  Nu-Step L2 for 10 min. Use of UE's/LE's. Hot pack on lumbar spine for pain relief. Discussion of symptoms from previous session   Neuro Re-Ed:   All graded motions for desensitization.   Standing scap retractions with Blue theraband: 1x20. Used as pulling motion related to use at work for graded motion in pain free range   Squatting/lifting mechanics with box: 1x10. Education on squat technique to reduce use of back muscles    Supine post pelvic tilts to reduce tension in pelvic floor and lumbar paraspinals: 1x20   Supine TrA  activation with tactile cues: 2x30 sec    Supine TrA marches: 1x20    Supine TrA dead bugs: 1x20  Education on log roll technique for postural correction and reduce strain on back muscles    PT Education - 04/25/20 1749    Education Details Form/technique lifting mechanics and log roll for bed mobility.    Person(s) Educated Patient    Methods Explanation;Demonstration    Comprehension Verbalized understanding;Returned demonstration               PT Long Term Goals - 04/11/20 0805      PT LONG TERM GOAL #1   Title Pt will improve FOTO score to 69 to display perceived improvements in functional mobility.    Baseline 8/18: 53    Time 4    Period Weeks    Status New    Target Date 05/08/20      PT LONG TERM GOAL #2   Title Pt will report pain < 4/10 NPS after a work day to display improved ability to perform job tasks.    Baseline 8/18: 8-10/10 pain NPS after work day.    Time 4    Period Weeks    Status New    Target Date 05/08/20  PT LONG TERM GOAL #3   Title Pt will be able to complete 45 min drive with proper posture in his truck to improve lumbar pain    Baseline 8/18: LBP with driving up to 0/81 NPS    Time 4    Period Weeks    Status New    Target Date 05/08/20                 Plan - 04/25/20 1750    Clinical Impression Statement Due to low pain 2/10 NPS, pt able to perform graded exercise with no exacerbation in symptoms. Pt educated on proper squatting and lifting techniques to reduce use of back muscles to prevent injury. Pt also educated on log roll tehcnique for neutral spine with bed mobility. Pt progressed with post pelvic tilts and TrA progression with no increase in symptoms. Pt educated on performing graded motion and mobility with no pain for pain modulation and desensitization. Pt can continue to benefit from skilled PT to improve functional mobility.    Personal Factors and Comorbidities Comorbidity 1;Time since onset of  injury/illness/exacerbation    Examination-Activity Limitations Bend;Locomotion Level;Lift    Examination-Participation Restrictions Occupation    Stability/Clinical Decision Making Evolving/Moderate complexity    Rehab Potential Fair    PT Frequency 2x / week    PT Duration 4 weeks    PT Treatment/Interventions Electrical Stimulation;Moist Heat;Traction;Functional mobility training;Therapeutic activities;Therapeutic exercise;Neuromuscular re-education;Patient/family education;Manual techniques;ADLs/Self Care Home Management;Dry needling    PT Next Visit Plan Prone manual therapy, graded exercise    PT Home Exercise Plan Lumbar trunk rotations, standing lumbar extensions, supine piriformis/glut stretch,    Consulted and Agree with Plan of Care Patient           Patient will benefit from skilled therapeutic intervention in order to improve the following deficits and impairments:  Hypomobility, Decreased mobility, Increased muscle spasms, Pain  Visit Diagnosis: Chronic midline low back pain without sciatica  Bilateral hip pain     Problem List There are no problems to display for this patient.  Cammie Hineman, PT, DPT # [id number] Ronnie Derby, SPT 04/26/2020, 4:11 PM  [facility] [address] [place], [place], [address] Phone: [phone]   Fax:  [phone]  Name: Arthur Gonzalez MRN: [id number] Date of Birth: [birth year]

## 2020-04-30 ENCOUNTER — Encounter: Payer: Self-pay | Admitting: Physical Therapy

## 2020-04-30 ENCOUNTER — Other Ambulatory Visit: Payer: Self-pay

## 2020-04-30 ENCOUNTER — Ambulatory Visit: Payer: BC Managed Care – PPO

## 2020-04-30 ENCOUNTER — Encounter: Payer: BC Managed Care – PPO | Admitting: Physical Therapy

## 2020-04-30 DIAGNOSIS — M545 Low back pain, unspecified: Secondary | ICD-10-CM

## 2020-04-30 DIAGNOSIS — M25552 Pain in left hip: Secondary | ICD-10-CM

## 2020-04-30 NOTE — Therapy (Signed)
[facility] [address] [place], [place], [address] Phone: [phone]   Fax:  [phone]  Physical Therapy Treatment  Patient Details  Name: Arthur Gonzalez MRN: [id number] Date of Birth: Oct 25, 1980 Referring Provider (PT): Dr. Landry Mellow   Encounter Date: 04/30/2020   PT End of Session - 04/30/20 1729    Visit Number 6    Number of Visits 9    Date for PT Re-Evaluation 05/08/20    Authorization - Visit Number 6    Authorization - Number of Visits 10    PT Start Time 1630    PT Stop Time 1716    PT Time Calculation (min) 46 min    Activity Tolerance Patient tolerated treatment well;No increased pain    Behavior During Therapy [facility] for tasks assessed/performed           History reviewed. No pertinent past medical history.  History reviewed. No pertinent surgical history.  There were no vitals filed for this visit.   Subjective Assessment - 04/30/20 1727    Subjective Pt reports 2/10 NPS. Pt states no increased pain after previous session. Pt had increase in back pain with push mowing lawn over the weekend. Continued elimination of referring B groin pain to B testicles.    Limitations Sitting;Lifting;Standing;Walking    Patient Stated Goals Prevent surgery. Improve pain. Ability to keep working at current job.    Currently in Pain? Yes    Pain Score 2     Pain Location Back    Pain Orientation Right;Left;Lower    Pain Descriptors / Indicators Aching;Discomfort    Pain Type Chronic pain    Pain Onset More than a month ago    Pain Frequency Intermittent    Multiple Pain Sites No    Pain Onset More than a month ago          There.ex:   Nu-Step L5 for 10 min. UE's/LE's used   Seated thoracic flexion against bolster. 2x15. Tactile cues for form/technique   Seated thoracic extension into bolster. 2x15. Tactile cues for form/technique.  Supine B thoracic open books: 1x12, verbal cues for elbows bent for improved comfort and  deep breaths at end range.   Neuro Re-ed:  Supine Post pelvic tilts for postural re-ed: Marland Kitchen Verbal cues for form/technique: 2x20  Supine dead bugs for core activation: 2x20   Paloff walkouts with 30 lbs at [employer] for core activation and maintaining neutral spine: 1x10, 5 steps out.      PT Long Term Goals - 04/11/20 0805      PT LONG TERM GOAL #1   Title Pt will improve FOTO score to 69 to display perceived improvements in functional mobility.    Baseline 8/18: 53    Time 4    Period Weeks    Status New    Target Date 05/08/20      PT LONG TERM GOAL #2   Title Pt will report pain < 4/10 NPS after a work day to display improved ability to perform job tasks.    Baseline 8/18: 8-10/10 pain NPS after work day.    Time 4    Period Weeks    Status New    Target Date 05/08/20      PT LONG TERM GOAL #3   Title Pt will be able to complete 45 min drive with proper posture in his truck to improve lumbar pain    Baseline 8/18: LBP with driving up to 2/77  NPS    Time 4    Period Weeks    Status New    Target Date 05/08/20                 Plan - 04/30/20 1730    Clinical Impression Statement Pt continues to display reduced pain levels and able to perform core and thoracic mobility exercises with no increase in pain. Pt progressing core activation to resisted paloff wlak outs with no increased pain and spine mobility with supine open books and seated thoracic flex and ext against small bolster on lumbar spine. Pt had no increase in pain after treatment and required minimal verbal cues to perform exercises for form/technique. Education provided on graded motion for desensitization of back pain. Pt can continue to benefit from skilled PT to improve functional mobility.    Personal Factors and Comorbidities Comorbidity 1;Time since onset of injury/illness/exacerbation    Examination-Activity Limitations Bend;Locomotion Level;Lift    Examination-Participation Restrictions Occupation     Stability/Clinical Decision Making Evolving/Moderate complexity    Rehab Potential Fair    PT Frequency 2x / week    PT Duration 4 weeks    PT Treatment/Interventions Electrical Stimulation;Moist Heat;Traction;Functional mobility training;Therapeutic activities;Therapeutic exercise;Neuromuscular re-education;Patient/family education;Manual techniques;ADLs/Self Care Home Management;Dry needling    PT Next Visit Plan Prone manual therapy, graded exercise    PT Home Exercise Plan Lumbar trunk rotations, standing lumbar extensions, supine piriformis/glut stretch,    Consulted and Agree with Plan of Care Patient           Patient will benefit from skilled therapeutic intervention in order to improve the following deficits and impairments:  Hypomobility, Decreased mobility, Increased muscle spasms, Pain  Visit Diagnosis: Chronic midline low back pain without sciatica  Bilateral hip pain     Problem List There are no problems to display for this patient.   Ronnie Derby, SPT 04/30/2020, 5:35 PM  [facility] [address] [place], [place], [address] Phone: [phone]   Fax:  [phone]  Name: Arthur Gonzalez MRN: [id number] Date of Birth: 12-21-1980

## 2020-05-02 ENCOUNTER — Encounter: Payer: BC Managed Care – PPO | Admitting: Physical Therapy

## 2020-05-02 ENCOUNTER — Encounter: Payer: Self-pay | Admitting: Physical Therapy

## 2020-05-02 ENCOUNTER — Other Ambulatory Visit: Payer: Self-pay

## 2020-05-02 ENCOUNTER — Ambulatory Visit: Payer: BC Managed Care – PPO

## 2020-05-02 DIAGNOSIS — G8929 Other chronic pain: Secondary | ICD-10-CM

## 2020-05-02 DIAGNOSIS — M545 Low back pain: Secondary | ICD-10-CM | POA: Diagnosis not present

## 2020-05-02 DIAGNOSIS — M25551 Pain in right hip: Secondary | ICD-10-CM

## 2020-05-02 NOTE — Therapy (Addendum)
[facility] [address] [place], [place], [address] Phone: [phone]   Fax:  [phone]  Physical Therapy Treatment  Patient Details  Name: MIROSLAV GIN MRN: [id number] Date of Birth: 06-18-1981 Referring Provider (PT): Dr. Landry Mellow   Encounter Date: 05/02/2020    05/02/20 1655  PT Visits / Re-Eval  Visit Number 7  Number of Visits 9  Date for PT Re-Evaluation 05/08/20  Authorization  Authorization - Visit Number 7  Authorization - Number of Visits 10  PT Time Calculation  PT Start Time 1650  PT Stop Time 1728  PT Time Calculation (min) 38 min  PT - End of Session  Activity Tolerance Patient tolerated treatment well;No increased pain  Behavior During Therapy [facility] for tasks assessed/performed   History reviewed. No pertinent past medical history.  History reviewed. No pertinent surgical history.  There were no vitals filed for this visit.   Subjective Assessment - 05/02/20 1653    Subjective Pt reports 1-2/10 pain NPS. Pt says he has still been "taking it easy" at work. 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    Limitations Sitting;Lifting;Standing;Walking    Patient Stated Goals Prevent surgery. Improve pain. Ability to keep working at current job.    Currently in Pain? Yes    Pain Score 2     Pain Location Back    Pain Orientation Right;Left;Lower    Pain Descriptors / Indicators Aching;Discomfort    Pain Type Chronic pain    Pain Onset More than a month ago    Pain Onset More than a month ago           There.ex:   Nu-Step L5 for 10 min. UE's/LE's.  Paloff walk outs at [employer]: 40 lbs with 3 steps and 5 presses. 1x5 reps. Increased difficulty maintaining BUE's straight ahead stepping to R.   R Standing thoracic rotation and row at [employer]: 1x12, 30 lbs   B flexion extension D1/D2 pattern at [employer] with BUE's: 30 lbs. Verbal cues for form/technique.  10-15 reps ea side   Lifting mechanics lifting Box with 15 lbs (on step) placing on standard height chair back to step: 1x8. Increased R knee pain.    PT Long Term Goals - 04/11/20 0805      PT LONG TERM GOAL #1   Title Pt will improve FOTO score to 69 to display perceived improvements in functional mobility.    Baseline 8/18: 53    Time 4    Period Weeks    Status New    Target Date 05/08/20      PT LONG TERM GOAL #2   Title Pt will report pain < 4/10 NPS after a work day to display improved ability to perform job tasks.    Baseline 8/18: 8-10/10 pain NPS after work day.    Time 4    Period Weeks    Status New    Target Date 05/08/20      PT LONG TERM GOAL #3   Title Pt will be able to complete 45 min drive with proper posture in his truck to improve lumbar pain    Baseline 8/18: LBP with driving up to 4/16 NPS    Time 4    Period Weeks    Status New    Target Date 05/08/20                05/02/20 1736  Plan  Clinical Impression Statement Continued reduced LBP. Pt progressing to standing core strength activity at [employer] with no increase in LBP. Pt progressing to D1/D2 flexion/ext patterns for core strength and improving thoracic mobility. Pt limited in lifting mechanics with R knee pain due to squatting from previous injuries. Pt can continue to benefit from skilled PT to further improve functional mobility with decreased LBP.  Personal Factors and Comorbidities Comorbidity 1;Time since onset of injury/illness/exacerbation  Examination-Activity Limitations Bend;Locomotion Level;Lift  Examination-Participation Restrictions Occupation  Pt will benefit from skilled therapeutic intervention in order to improve on the following deficits Hypomobility;Decreased mobility;Increased muscle spasms;Pain  Stability/Clinical Decision Making Evolving/Moderate complexity  Rehab Potential Fair  PT Frequency 2x / week  PT Duration 4 weeks  PT Treatment/Interventions Electrical  Stimulation;Moist Heat;Traction;Functional mobility training;Therapeutic activities;Therapeutic exercise;Neuromuscular re-education;Patient/family education;Manual techniques;ADLs/Self Care Home Management;Dry needling  PT Next Visit Plan Prone manual therapy, graded exercise  PT Home Exercise Plan Lumbar trunk rotations, standing lumbar extensions, supine piriformis/glut stretch,  Consulted and Agree with Plan of Care Patient      Visit Diagnosis: Chronic midline low back pain without sciatica  Bilateral hip pain     Problem List There are no problems to display for this patient.   Ronnie Derby, SPT 05/02/2020, 4:56 PM  [place] [facility] [address] [place], [place], [address] Phone: [phone]   Fax:  [phone]  Name: PRANAV LINCE MRN: [id number] Date of Birth: 18-Sep-1980

## 2020-05-07 ENCOUNTER — Ambulatory Visit: Payer: BC Managed Care – PPO

## 2020-05-07 ENCOUNTER — Encounter: Payer: Self-pay | Admitting: Physical Therapy

## 2020-05-07 ENCOUNTER — Other Ambulatory Visit: Payer: Self-pay

## 2020-05-07 DIAGNOSIS — M25551 Pain in right hip: Secondary | ICD-10-CM

## 2020-05-07 DIAGNOSIS — M545 Low back pain: Secondary | ICD-10-CM | POA: Diagnosis not present

## 2020-05-07 DIAGNOSIS — G8929 Other chronic pain: Secondary | ICD-10-CM

## 2020-05-07 NOTE — Therapy (Signed)
[facility] [address] [place], [place], [address] Phone: [phone]   Fax:  [phone]  Physical Therapy Treatment Recert Period: 04/11/2020-05/08/2020  Patient Details  Name: Arthur Gonzalez MRN: [id number] Date of Birth: 1981-02-04 Referring Provider (PT): Dr. Landry Mellow   Encounter Date: 05/07/2020   PT End of Session - 05/07/20 1639    Visit Number 8    Number of Visits 9    Date for PT Re-Evaluation 05/08/20    Authorization - Visit Number 8    Authorization - Number of Visits 10    PT Start Time 1623    Activity Tolerance Patient tolerated treatment well;No increased pain    Behavior During Therapy [facility] for tasks assessed/performed           History reviewed. No pertinent past medical history.  History reviewed. No pertinent surgical history.  There were no vitals filed for this visit.   Subjective Assessment - 05/07/20 1636    Subjective Pt continues to have less pain due to "taking it easy" at work. Currently at 2/10 NPS. Pt reports PT has improved his bowel and bladder function however he believes his biggest benefit has been not working as hard at work and is planning on finding new career path.    Limitations Sitting;Lifting;Standing;Walking    Patient Stated Goals Prevent surgery. Improve pain. Ability to keep working at current job.    Currently in Pain? Yes    Pain Score 2     Pain Orientation Right;Left;Lower    Pain Descriptors / Indicators Aching;Discomfort    Pain Onset More than a month ago    Pain Orientation Right;Left    Pain Descriptors / Indicators Aching;Dull    Pain Onset More than a month ago           Reviewed goals prior to session.    There.ex:    Nu-Step L5 for 8 min. LE's only. Discussion of future POC. Education on frequent breaks when performing work at home to reduce LBP.    Planks on elbows: 1x30 sec, 2x40 sec. Verbal cues for form/tehcnique with good carryover after cuing.  B  Side planks: 2x20 sec. Reports increased difficulty compared to forward planks. Tactile and verbal cues for form/tehcnique.  Supine bridges: x1. Discomfort.   Supine bridge with post tilts: 2x10, with 5 sec holds. Reports of no discomfort in low back.   Resisted walking at [employer]:    Forwards/backwards/L lat steps R/Lat steps: x2, 110 lbs. Verbal and tactile cues for form/technique. Noted increased difficulty controlling weight when side steeping to the L.   PT Long Term Goals - 04/11/20 0805      PT LONG TERM GOAL #1   Title Pt will improve FOTO score to 69 to display perceived improvements in functional mobility.    Baseline 8/18: 53    Time 4    Period Weeks    Status New    Target Date 05/08/20      PT LONG TERM GOAL #2   Title Pt will report pain < 4/10 NPS after a work day to display improved ability to perform job tasks.    Baseline 8/18: 8-10/10 pain NPS after work day.    Time 4    Period Weeks    Status New    Target Date 05/08/20      PT LONG TERM GOAL #3   Title Pt will be able to complete 45 min drive with proper posture in  his truck to improve lumbar pain    Baseline 8/18: LBP with driving up to 3/74 NPS    Time 4    Period Weeks    Status New    Target Date 05/08/20          Patient will benefit from skilled therapeutic intervention in order to improve the following deficits and impairments:     Visit Diagnosis: Chronic midline low back pain without sciatica  Bilateral hip pain     Problem List There are no problems to display for this patient.   Ronnie Derby, SPT 05/07/2020, 4:43 PM  [place] [facility] [address] [place], [place], [address] Phone: [phone]   Fax:  [phone]  Name: Arthur Gonzalez MRN: [id number] Date of Birth: [birth year]

## 2020-05-09 ENCOUNTER — Other Ambulatory Visit: Payer: Self-pay

## 2020-05-09 ENCOUNTER — Encounter: Payer: Self-pay | Admitting: Physical Therapy

## 2020-05-09 ENCOUNTER — Ambulatory Visit: Payer: BC Managed Care – PPO

## 2020-05-09 DIAGNOSIS — M25552 Pain in left hip: Secondary | ICD-10-CM

## 2020-05-09 DIAGNOSIS — M545 Low back pain, unspecified: Secondary | ICD-10-CM

## 2020-05-09 NOTE — Therapy (Signed)
[facility] [address] [place], [place], [address] Phone: [phone]   Fax:  [phone]  Physical Therapy Treatment  Patient Details  Name: Arthur Gonzalez MRN: [id number] Date of Birth: 1980/12/08 Referring Provider (PT): Dr. Candelaria Stagers   Encounter Date: 05/09/2020   PT End of Session - 05/09/20 1711    Visit Number 9    Number of Visits 13    Date for PT Re-Evaluation 05/21/20    Authorization - Visit Number 9    Authorization - Number of Visits 14    PT Start Time 5400    PT Stop Time 8676    PT Time Calculation (min) 39 min    Activity Tolerance Patient tolerated treatment well;No increased pain    Behavior During Therapy [facility] for tasks assessed/performed           History reviewed. No pertinent past medical history.  History reviewed. No pertinent surgical history.  There were no vitals filed for this visit.   Subjective Assessment - 05/09/20 1709    Subjective Pt reports current LBP at 4/10 pain via NPS localized to L4-L5 region. No referral to B groin/testicles.    Limitations Sitting;Lifting;Standing;Walking    Patient Stated Goals Prevent surgery. Improve pain. Ability to keep working at current job.    Currently in Pain? Yes    Pain Score 4     Pain Location Back    Pain Orientation Right;Left;Lower    Pain Descriptors / Indicators Aching;Discomfort    Pain Type Chronic pain    Pain Onset More than a month ago    Pain Frequency Intermittent    Multiple Pain Sites No    Pain Onset More than a month ago            There.ex:   Nu-Step L5 for 5 min. UE's/LE's.   Standard plank: 3x40 sec. Min verbal cues for form/technique.   Prone plank ups: 3x30 sec   Side planks: B, 2x30 sec, tactile cues for form/technique. Difficulty maintaining for 20 sec. Able to consistently hold for 25 sec.   Supine bridges on exercise ball: 1x10. Tactile cues. Reports of not feeling core so ball moved distally where B feet placed  on ball compared to BLE's.   B bird dogs: 1x10. Box placed on sacrum as tactile cue to maintain level pelvis. Noted shaking and need to reset box due to difficulty.    PT Education - 05/09/20 1711    Education Details form/technique with exercise.    Person(s) Educated Patient    Methods Explanation;Tactile cues;Verbal cues               PT Long Term Goals - 05/07/20 1706      PT LONG TERM GOAL #1   Title Pt will improve FOTO score to 69 to display perceived improvements in functional mobility.    Baseline 8/18: 53; 9/14: 53    Time 4    Period Weeks    Status Not Met    Target Date 06/04/20      PT LONG TERM GOAL #2   Title Pt will report pain < 4/10 NPS after a work day to display improved ability to perform job tasks.    Baseline 8/18: 8-10/10 pain NPS after work day.    Time 4    Period Weeks    Status Not Met    Target Date 06/04/20      PT LONG TERM GOAL #3  Title Pt will be able to complete 45 min drive with proper posture in his truck to improve lumbar pain    Baseline 8/18: LBP with driving up to 4/65 NPS; 9/14: 5-8/10 NPS with drives to work    Time 4    Period Weeks    Status Not Met    Target Date 06/04/20                 Plan - 05/09/20 1747    Clinical Impression Statement Progression of core exercises to exericse ball and prone plank ups. Pt performed with good form/technique with min verbal cues. Pt displayed most difficulty with Bird dogs requiring mod-max verbal cues for neutral pelvis with fair carryover. Pt can continue to benefit from further skilled PT treatment to reduce LBP to improve functional mobility.    Personal Factors and Comorbidities Comorbidity 1;Time since onset of injury/illness/exacerbation    Examination-Activity Limitations Bend;Locomotion Level;Lift    Examination-Participation Restrictions Occupation    Stability/Clinical Decision Making Evolving/Moderate complexity    Rehab Potential Fair    PT Frequency 2x / week     PT Duration 2 weeks    PT Treatment/Interventions Electrical Stimulation;Moist Heat;Traction;Functional mobility training;Therapeutic activities;Therapeutic exercise;Neuromuscular re-education;Patient/family education;Manual techniques;ADLs/Self Care Home Management;Dry needling    PT Next Visit Plan Progress core strength and thoracic/lumbar mobility    PT Home Exercise Plan Lumbar trunk rotations, standing lumbar extensions, supine piriformis/glut stretch,    Consulted and Agree with Plan of Care Patient           Patient will benefit from skilled therapeutic intervention in order to improve the following deficits and impairments:  Hypomobility, Decreased mobility, Increased muscle spasms, Pain  Visit Diagnosis: Chronic midline low back pain without sciatica  Bilateral hip pain     Problem List There are no problems to display for this patient.   Larna Daughters, SPT 05/09/2020, 5:50 PM  [facility] [address] [place], [place], [address] Phone: [phone]   Fax:  [phone]  Name: Arthur Gonzalez MRN: [id number] Date of Birth: 11-03-1980

## 2020-05-14 ENCOUNTER — Encounter: Payer: BC Managed Care – PPO | Admitting: Physical Therapy

## 2020-05-16 ENCOUNTER — Ambulatory Visit: Payer: BC Managed Care – PPO | Admitting: Physical Therapy

## 2020-05-21 ENCOUNTER — Other Ambulatory Visit: Payer: Self-pay

## 2020-05-21 ENCOUNTER — Ambulatory Visit: Payer: BC Managed Care – PPO | Admitting: Physical Therapy

## 2020-05-23 ENCOUNTER — Ambulatory Visit: Payer: BC Managed Care – PPO | Admitting: Physical Therapy

## 2020-05-28 ENCOUNTER — Ambulatory Visit: Payer: BC Managed Care – PPO | Admitting: Physical Therapy

## 2020-05-30 ENCOUNTER — Ambulatory Visit: Payer: BC Managed Care – PPO | Admitting: Physical Therapy

## 2020-06-04 ENCOUNTER — Ambulatory Visit: Payer: BC Managed Care – PPO | Admitting: Physical Therapy

## 2020-06-05 ENCOUNTER — Other Ambulatory Visit: Payer: Self-pay

## 2020-06-05 ENCOUNTER — Emergency Department: Payer: BC Managed Care – PPO

## 2020-06-05 ENCOUNTER — Emergency Department
Admission: EM | Admit: 2020-06-05 | Discharge: 2020-06-05 | Disposition: A | Discharge status: Home / Self Care | Payer: BC Managed Care – PPO | Attending: Emergency Medicine | Admitting: Emergency Medicine

## 2020-06-05 DIAGNOSIS — J36 Peritonsillar abscess: Secondary | ICD-10-CM | POA: Diagnosis not present

## 2020-06-05 DIAGNOSIS — Z20822 Contact with and (suspected) exposure to covid-19: Secondary | ICD-10-CM | POA: Diagnosis not present

## 2020-06-05 DIAGNOSIS — R070 Pain in throat: Secondary | ICD-10-CM | POA: Diagnosis present

## 2020-06-05 LAB — COMPREHENSIVE METABOLIC PANEL
ALT: 28 U/L (ref 0–44)
AST: 19 U/L (ref 15–41)
Albumin: 4.2 g/dL (ref 3.5–5.0)
Alkaline Phosphatase: 55 U/L (ref 38–126)
Anion gap: 9 (ref 5–15)
BUN: 12 mg/dL (ref 6–20)
CO2: 28 mmol/L (ref 22–32)
Calcium: 9.3 mg/dL (ref 8.9–10.3)
Chloride: 101 mmol/L (ref 98–111)
Creatinine, Ser: 0.88 mg/dL (ref 0.61–1.24)
GFR, Estimated: >60 mL/min (ref >60)
Glucose, Bld: 106 mg/dL — ABNORMAL HIGH (ref 70–99)
Potassium: 3.6 mmol/L (ref 3.5–5.1)
Sodium: 138 mmol/L (ref 135–145)
Total Bilirubin: 0.9 mg/dL (ref 0.3–1.2)
Total Protein: 7.5 g/dL (ref 6.5–8.1)

## 2020-06-05 LAB — CBC WITH DIFFERENTIAL/PLATELET
Abs Immature Granulocytes: 0.09 10*3/uL — ABNORMAL HIGH (ref 0.00–0.07)
Basophils Absolute: 0 10*3/uL (ref 0.0–0.1)
Basophils Relative: 0 %
Eosinophils Absolute: 0 10*3/uL (ref 0.0–0.5)
Eosinophils Relative: 0 %
HCT: 39 % (ref 39.0–52.0)
Hemoglobin: 13.3 g/dL (ref 13.0–17.0)
Immature Granulocytes: 1 %
Lymphocytes Relative: 6 %
Lymphs Abs: 0.6 10*3/uL — ABNORMAL LOW (ref 0.7–4.0)
MCH: 29.3 pg (ref 26.0–34.0)
MCHC: 34.1 g/dL (ref 30.0–36.0)
MCV: 85.9 fL (ref 80.0–100.0)
Monocytes Absolute: 0.7 10*3/uL (ref 0.1–1.0)
Monocytes Relative: 6 %
Neutro Abs: 10.2 10*3/uL — ABNORMAL HIGH (ref 1.7–7.7)
Neutrophils Relative %: 87 %
Platelets: 224 10*3/uL (ref 150–400)
RBC: 4.54 MIL/uL (ref 4.22–5.81)
RDW: 13 % (ref 11.5–15.5)
WBC: 11.6 10*3/uL — ABNORMAL HIGH (ref 4.0–10.5)
nRBC: 0 % (ref 0.0–0.2)

## 2020-06-05 LAB — RESPIRATORY PANEL BY RT PCR (FLU A&B, COVID)
Influenza A by PCR: NEGATIVE
Influenza B by PCR: NEGATIVE
SARS Coronavirus 2 by RT PCR: NEGATIVE

## 2020-06-05 LAB — LACTIC ACID, PLASMA: Lactic Acid, Venous: 0.9 mmol/L (ref 0.5–1.9)

## 2020-06-05 MED ORDER — BENZOCAINE 20 % MT AERO
INHALATION_SPRAY | Freq: Once | OROMUCOSAL | Status: AC
  Filled 2020-06-05 (×2): qty 57

## 2020-06-05 MED ORDER — IOHEXOL 300 MG/ML  SOLN
75.0000 mL | Freq: Once | INTRAMUSCULAR | Status: AC | PRN
  Administered 2020-06-05: 75 mL via INTRAVENOUS
  Filled 2020-06-05: qty 75

## 2020-06-05 MED ORDER — SODIUM CHLORIDE 0.9 % IV SOLN
3.0000 g | Freq: Once | INTRAVENOUS | Status: AC
  Administered 2020-06-05: 3 g via INTRAVENOUS
  Filled 2020-06-05: qty 8

## 2020-06-05 MED ORDER — AMOXICILLIN-POT CLAVULANATE 875-125 MG PO TABS: 1.0000 | ORAL_TABLET | Freq: Two times a day (BID) | ORAL | 0 refills | Status: AC

## 2020-06-05 MED ORDER — DEXAMETHASONE SODIUM PHOSPHATE 10 MG/ML IJ SOLN
10.0000 mg | Freq: Once | INTRAMUSCULAR | Status: AC
  Administered 2020-06-05: 10 mg via INTRAVENOUS
  Filled 2020-06-05: qty 1

## 2020-06-05 MED ORDER — LIDOCAINE-EPINEPHRINE 1 %-1:100000 IJ SOLN
30.0000 mL | Freq: Once | INTRAMUSCULAR | Status: AC
  Administered 2020-06-05: 30 mL
  Filled 2020-06-05: qty 1

## 2020-06-05 MED ORDER — HYDROCODONE-ACETAMINOPHEN 5-325 MG PO TABS: 1.0000 | ORAL_TABLET | Freq: Four times a day (QID) | ORAL | 0 refills | Status: DC | PRN

## 2020-06-05 MED ORDER — PREDNISONE 10 MG PO TABS: ORAL_TABLET | ORAL | 0 refills | Status: DC

## 2020-06-05 MED ORDER — HYDROCODONE-ACETAMINOPHEN 5-325 MG PO TABS
1.0000 | ORAL_TABLET | Freq: Once | ORAL | Status: AC
  Administered 2020-06-05: 1 via ORAL
  Filled 2020-06-05: qty 1

## 2020-06-05 NOTE — Discharge Instructions (Addendum)
Continue taking the Augmentin 875 twice daily for an additional 10 days and a prescription was sent to your pharmacy.  Discontinue taking the methylprednisolone that was prescribed for you in [place].  Begin taking the prednisone starting with 6 tablets and tapering down by 1 tablet each day.  The Norco is hydrocodone with acetaminophen as needed for pain.  Increase fluids and advance diet as tolerated starting with soft foods and working your way up.  Return to the emergency department if any severe worsening of your symptoms or urgent concerns.  You may also follow-up with Dr. Willeen Cass in his office or [place] next week.  His contact information is listed on your discharge papers.  Dr. Willeen Cass also had in his notes that you may want to consult a dentist about one of the teeth in that area as it may have a early infection in it as well.

## 2020-06-05 NOTE — ED Triage Notes (Signed)
Pt comes via POV from home with c/o sore throat. Pt states he was seen in [place] and evaluated. Pt states he is not getting better.

## 2020-06-05 NOTE — ED Notes (Signed)
See triage note, pt reports had wisdom tooth that was moving on Saturday with swollen throat and neck, went to ED in [place] with CT, infection around left tonsil. Was given antibiotics and steroids with no improvement.  C/o pain in left ear, left side of face, neck and under chin.  Swelling noted to left side of face.  Pt states difficulty swallowing.  Denies SHOB. Pt speaking in complete sentences with clear speech. NAD noted, ambulatory to treatment room.

## 2020-06-05 NOTE — Consult Note (Signed)
Lucus, Lambertson [id number] 1981-08-11 Sandi Mealy, MD  Reason for Consult: tonsil abscess Requesting Physician: Sharman Cheek, MD Consulting Physician: Sandi Mealy, MD  HPI: This 39 y.o. year old male was admitted on 06/05/2020 for sore throat, worsening with difficulty swallowing the past 5 days despite being started on Augmentin and a Medrol dose pack. No prior h/o recurrent tonsillitis.  Allergies:   Medications: Augmentin, Medrol.   PMH: History reviewed. No pertinent past medical history.  Fam Hx: No family history on file.  Soc Hx:  Social History   Socioeconomic History  . Marital status: Married    Spouse name: Not on file  . Number of children: Not on file  . Years of education: Not on file  . Highest education level: Not on file  Occupational History  . Not on file  Tobacco Use  . Smoking status: Not on file  Substance and Sexual Activity  . Alcohol use: Not on file  . Drug use: Not on file  . Sexual activity: Not on file  Other Topics Concern  . Not on file  Social History Narrative  . Not on file   Social Determinants of Health   Financial Resource Strain:   . Difficulty of Paying Living Expenses: Not on file  Food Insecurity:   . Worried About Programme researcher, broadcasting/film/video in the Last Year: Not on file  . Ran Out of Food in the Last Year: Not on file  Transportation Needs:   . Lack of Transportation (Medical): Not on file  . Lack of Transportation (Non-Medical): Not on file  Physical Activity:   . Days of Exercise per Week: Not on file  . Minutes of Exercise per Session: Not on file  Stress:   . Feeling of Stress : Not on file  Social Connections:   . Frequency of Communication with Friends and Family: Not on file  . Frequency of Social Gatherings with Friends and Family: Not on file  . Attends Religious Services: Not on file  . Active Member of Clubs or Organizations: Not on file  . Attends Banker Meetings: Not on file  . Marital  Status: Not on file  Intimate Partner Violence:   . Fear of Current or Ex-Partner: Not on file  . Emotionally Abused: Not on file  . Physically Abused: Not on file  . Sexually Abused: Not on file    PSH: History reviewed. No pertinent surgical history.. Procedures since admission: No admission procedures for hospital encounter.  ROS: Review of systems normal other than 12 systems except per HPI.  PHYSICAL EXAM Vitals:  Vitals:   06/05/20 1006  BP: (!) 160/92  Pulse: 63  Resp: 18  Temp: 98.9 F (37.2 C)  SpO2: 97%  .  General: Well-developed, Well-nourished in pain but no stridor Mood: Mood and affect well adjusted, pleasant and cooperative. Orientation: Grossly alert and oriented. Vocal Quality: No hoarseness. Communicates verbally.Hot potato voice head and Face: NCAT. No facial asymmetry. No visible skin lesions. No significant facial scars. No tenderness with sinus percussion. Facial strength normal and symmetric. Ears: External ears with normal landmarks, no lesions. External auditory canals free of infection, cerumen impaction or lesions. Tympanic membranes intact with good landmarks and normal mobility on pneumatic otoscopy. No middle ear effusion. Hearing: Speech reception grossly normal. Nose: External nose normal with midline dorsum and no lesions or deformity. Nasal Cavity reveals essentially midline septum with normal inferior turbinates. No significant mucosal congestion or erythema. Nasal secretions are  minimal and clear. No polyps seen on anterior rhinoscopy. Oral Cavity/ Oropharynx: Lips are normal with no lesions. Teeth no frank dental caries. Gingiva healthy with no lesions or gingivitis. Swelling of left tonsil extends onto soft palate with uvula deviated to the right.  Indirect Laryngoscopy/Nasopharyngoscopy: Visualization of the larynx, hypopharynx and nasopharynx is not possible in this setting with routine examination. Neck: Supple and symmetric with no palpable  masses, tenderness or crepitance. The trachea is midline. Thyroid gland is soft, nontender and symmetric with no masses or enlargement. Parotid and submandibular glands are soft, nontender and symmetric, without masses. Lymphatic: Tender JD nodes left neck without crepitance. Respiratory: Normal respiratory effort without labored breathing. Cardiovascular: Carotid pulse shows regular rate and rhythm Neurologic: Cranial Nerves II through XII are grossly intact. Eyes: Gaze and Ocular Motility are grossly normal. PERRLA. No visible nystagmus.  MEDICAL DECISION MAKING: Data Review:  Results for orders placed or performed during the hospital encounter of 06/05/20 (from the past 48 hour(s))  Comprehensive metabolic panel     Status: Abnormal   Collection Time: 06/05/20 11:20 AM  Result Value Ref Range   Sodium 138 135 - 145 mmol/L   Potassium 3.6 3.5 - 5.1 mmol/L   Chloride 101 98 - 111 mmol/L   CO2 28 22 - 32 mmol/L   Glucose, Bld 106 (H) 70 - 99 mg/dL    Comment: Glucose reference range applies only to samples taken after fasting for at least 8 hours.   BUN 12 6 - 20 mg/dL   Creatinine, Ser 6.72 0.61 - 1.24 mg/dL   Calcium 9.3 8.9 - 09.4 mg/dL   Total Protein 7.5 6.5 - 8.1 g/dL   Albumin 4.2 3.5 - 5.0 g/dL   AST 19 15 - 41 U/L   ALT 28 0 - 44 U/L   Alkaline Phosphatase 55 38 - 126 U/L   Total Bilirubin 0.9 0.3 - 1.2 mg/dL   GFR, Estimated >70 >96 mL/min   Anion gap 9 5 - 15    Comment: Performed at [facility], [address] [place], [place] [address]  CBC with Differential     Status: Abnormal   Collection Time: 06/05/20 11:20 AM  Result Value Ref Range   WBC 11.6 (H) 4.0 - 10.5 K/uL   RBC 4.54 4.22 - 5.81 MIL/uL   Hemoglobin 13.3 13.0 - 17.0 g/dL   HCT 29.4 39 - 52 %   MCV 85.9 80.0 - 100.0 fL   MCH 29.3 26.0 - 34.0 pg   MCHC 34.1 30.0 - 36.0 g/dL   RDW 76.5 46.5 - 03.5 %   Platelets 224 150 - 400 K/uL   nRBC 0.0 0.0 - 0.2 %   Neutrophils Relative % 87 %    Neutro Abs 10.2 (H) 1.7 - 7.7 K/uL   Lymphocytes Relative 6 %   Lymphs Abs 0.6 (L) 0.7 - 4.0 K/uL   Monocytes Relative 6 %   Monocytes Absolute 0.7 0.1 - 1.0 K/uL   Eosinophils Relative 0 %   Eosinophils Absolute 0.0 0.0 - 0.5 K/uL   Basophils Relative 0 %   Basophils Absolute 0.0 0.0 - 0.1 K/uL   Immature Granulocytes 1 %   Abs Immature Granulocytes 0.09 (H) 0.00 - 0.07 K/uL    Comment: Performed at [facility], [address] [place], [place] [address]  Lactic acid, plasma     Status: None   Collection Time: 06/05/20 11:21 AM  Result Value Ref Range   Lactic  Acid, Venous 0.9 0.5 - 1.9 mmol/L    Comment: Performed at [facility], [address] [place], [place] [address]  Respiratory Panel by RT PCR (Flu A&B, Covid) - Nasopharyngeal Swab     Status: None   Collection Time: 06/05/20 12:46 PM   Specimen: Nasopharyngeal Swab  Result Value Ref Range   SARS Coronavirus 2 by RT PCR NEGATIVE NEGATIVE    Comment: (NOTE) SARS-CoV-2 target nucleic acids are NOT DETECTED.  The SARS-CoV-2 RNA is generally detectable in upper respiratoy specimens during the acute phase of infection. The lowest concentration of SARS-CoV-2 viral copies this assay can detect is 131 copies/mL. A negative result does not preclude SARS-Cov-2 infection and should not be used as the sole basis for treatment or other patient management decisions. A negative result may occur with  improper specimen collection/handling, submission of specimen other than nasopharyngeal swab, presence of viral mutation(s) within the areas targeted by this assay, and inadequate number of viral copies (<131 copies/mL). A negative result must be combined with clinical observations, patient history, and epidemiological information. The expected result is Negative.  Fact Sheet for Patients:  https://www.moore.com/https://www.fda.gov/media/142436/download  Fact Sheet for Healthcare Providers:   https://www.young.biz/https://www.fda.gov/media/142435/download  This test is no t yet approved or cleared by the [place] FDA and  has been authorized for detection and/or diagnosis of SARS-CoV-2 by FDA under an Emergency Use Authorization (EUA). This EUA will remain  in effect (meaning this test can be used) for the duration of the COVID-19 declaration under Section 564(b)(1) of the Act, 21 U.S.C. section 360bbb-3(b)(1), unless the authorization is terminated or revoked sooner.     Influenza A by PCR NEGATIVE NEGATIVE   Influenza B by PCR NEGATIVE NEGATIVE    Comment: (NOTE) The Xpert Xpress SARS-CoV-2/FLU/RSV assay is intended as an aid in  the diagnosis of influenza from Nasopharyngeal swab specimens and  should not be used as a sole basis for treatment. Nasal washings and  aspirates are unacceptable for Xpert Xpress SARS-CoV-2/FLU/RSV  testing.  Fact Sheet for Patients: https://www.moore.com/https://www.fda.gov/media/142436/download  Fact Sheet for Healthcare Providers: https://www.young.biz/https://www.fda.gov/media/142435/download  This test is not yet approved or cleared by the [place] FDA and  has been authorized for detection and/or diagnosis of SARS-CoV-2 by  FDA under an Emergency Use Authorization (EUA). This EUA will remain  in effect (meaning this test can be used) for the duration of the  Covid-19 declaration under Section 564(b)(1) of the Act, 21  U.S.C. section 360bbb-3(b)(1), unless the authorization is  terminated or revoked. Performed at [facility], [address] [place], [place] [address]   . CT Soft Tissue Neck W Contrast  Result Date: 06/05/2020 CLINICAL DATA:  Swelling of the throat neck. Recent left tonsillitis. Symptoms persisting in worsened. EXAM: CT NECK WITH CONTRAST TECHNIQUE: Multidetector CT imaging of the neck was performed using the standard protocol following the bolus administration of intravenous contrast. CONTRAST:  75mL OMNIPAQUE IOHEXOL 300 MG/ML  SOLN COMPARISON:   08/05/2009 FINDINGS: Pharynx and larynx: 4 cm in diameter left peritonsillar abscess. Edematous inflammation in the parapharyngeal space of a mild degree. No evidence of distant abscess. No other mucosal or submucosal finding. Salivary glands: Parotid and submandibular glands are normal. Thyroid: Normal Lymph nodes: No enlarged or low-density nodes on either side of the neck. Vascular: Normal Limited intracranial: Normal Visualized orbits: Normal Mastoids and visualized paranasal sinuses: Clear Skeleton: Cervical spondylosis C5-6. Upper chest: Normal Other: None IMPRESSION: 4 cm in diameter left peritonsillar abscess. Edematous inflammation in  the parapharyngeal space of a mild degree. No evidence of distant abscess. Electronically Signed   By: Paulina Fusi M.D.   On: 06/05/2020 13:58  .   PROCEDURE: Preoperative Diagnosis: Left peritonsillar abscess Postoperative Diagnosis: Same Procedure: Incision and drainage of left peritonsillar abscess Indications: Left peritonsillar abscess Findings:over 12 cc of pus drained Description of Procedure: After discussing procedure and risks  (primarily bleeding) with the patient, the throat anesthetized with topical Hurricane spray and the region around the left tonsil injected with 1% lidocaine with epinephrine, 1:100,000. An 18 gage needle was used to aspirate above the tonsil, aspirating approximately 12 cc of purulence. This area was then opened with a 15 blade and a hemostat used to carefully bluntly open the peritonsillar space and residual purlence was drained. The patient tolerated the procedure well.   ASSESSMENT: Left peritonsillar abscess  PLAN: The abscess was successfully drained. Recommend d/c (if can tolerate PO fluids, which apparently was doing PTA) on additional 10 days of Augmentin and continue prednisone, going back to a 60 mg starting dose and tapering. He may want to see a dentist about some concerns he has about a molar on the left that does  indeed have a lucency around it on the scan, may have to have that pulled. Return to ER if symptoms worsen despite I&D. Can f/u in my office next week to see our PA as I will be out of town.    Sandi Mealy, MD 06/05/2020 5:28 PM

## 2020-06-05 NOTE — ED Provider Notes (Signed)
[facility] Emergency Department Provider Note  ____________________________________________   First MD Initiated Contact with Patient 06/05/20 1022     (approximate)  I have reviewed the triage vital signs and the nursing notes.   HISTORY  Chief Complaint Sore Throat   HPI Arthur Gonzalez is a 39 y.o. male presents to the ED with complaint of continued left-sided throat pain.  Patient was seen in the emergency department in [facility] 4 days ago at which time he was diagnosed with "tonsil infection".  Patient findings were positive for a WBC of 14.2 and a CT scan showing an asymmetrical left tonsil measuring 1.5 x 1.4 cm with a hypattenuating collection and adjacent phlegmonous changes.  Patient was treated with p.o. Augmentin and Decadron.  He is continue to take Augmentin and Medrol Dosepak.  Patient tried to get a referral to an ENT as he was instructed but has been unsuccessful.        History reviewed. No pertinent past medical history.  There are no problems to display for this patient.   History reviewed. No pertinent surgical history.  Prior to Admission medications   Medication Sig Start Date End Date Taking? Authorizing Provider  amoxicillin-clavulanate (AUGMENTIN) 875-125 MG tablet Take 1 tablet by mouth 2 (two) times daily for 10 days. 06/05/20 06/15/20  Tommi Rumps, PA-C  HYDROcodone-acetaminophen (NORCO/VICODIN) 5-325 MG tablet Take 1 tablet by mouth every 6 (six) hours as needed for moderate pain. 06/05/20   Tommi Rumps, PA-C  predniSONE (DELTASONE) 10 MG tablet Take 6 tablets  today, on day 2 take 5 tablets, day 3 take 4 tablets, day 4 take 3 tablets, day 5 take  2 tablets and 1 tablet the last day 06/05/20   Tommi Rumps, PA-C    Allergies Patient has no allergy information on record.  No family history on file.  Social History Social History   Tobacco Use  . Smoking status: Not on file  Substance Use Topics  .  Alcohol use: Not on file  . Drug use: Not on file    Review of Systems Constitutional: No fever/chills Eyes: No visual changes. ENT: Positive for sore throat.   Cardiovascular: Denies chest pain. Respiratory: Denies shortness of breath. Gastrointestinal: No abdominal pain.  No nausea, no vomiting.   Musculoskeletal: Negative for back pain. Skin: Negative for rash. Neurological: Negative for headaches, focal weakness or numbness.  ____________________________________________   PHYSICAL EXAM:  VITAL SIGNS: ED Triage Vitals [06/05/20 1006]  Enc Vitals Group     BP (!) 160/92     Pulse Rate 63     Resp 18     Temp 98.9 F (37.2 C)     Temp Source Oral     SpO2 97 %     Weight 204 lb (92.5 kg)     Height 5\' 9"  (1.753 m)     Head Circumference      Peak Flow      Pain Score 3     Pain Loc      Pain Edu?      Excl. in GC?     Constitutional: Alert and oriented. Well appearing and in no acute distress. Eyes: Conjunctivae are normal. PERRL. EOMI. Head: Atraumatic. Nose: No congestion/rhinnorhea. Mouth/Throat: Mucous membranes are moist.  Left tonsil enlargement, erythematous with uvular shifting to the right.  Patient continues to be able to maintain secretions and swallow with some minimal discomfort.  No active drainage is noted. Neck: No  stridor.   Cardiovascular: Normal rate, regular rhythm. Grossly normal heart sounds.  Good peripheral circulation. Respiratory: Normal respiratory effort.  No retractions. Lungs CTAB. Musculoskeletal: Moves upper and lower extremities with any difficulty.  Normal gait was noted. Neurologic:  Normal speech and language. No gross focal neurologic deficits are appreciated. No gait instability. Skin:  Skin is warm, dry and intact. No rash noted. Psychiatric: Mood and affect are normal. Speech and behavior are normal.  ____________________________________________   LABS (all labs ordered are listed, but only abnormal results are  displayed)  Labs Reviewed  COMPREHENSIVE METABOLIC PANEL - Abnormal; Notable for the following components:      Result Value   Glucose, Bld 106 (*)    All other components within normal limits  CBC WITH DIFFERENTIAL/PLATELET - Abnormal; Notable for the following components:   WBC 11.6 (*)    Neutro Abs 10.2 (*)    Lymphs Abs 0.6 (*)    Abs Immature Granulocytes 0.09 (*)    All other components within normal limits  RESPIRATORY PANEL BY RT PCR (FLU A&B, COVID)  LACTIC ACID, PLASMA    RADIOLOGY I, Tommi Rumps, personally viewed and evaluated these images (plain radiographs) as part of Arthur medical decision making, as well as reviewing the written report by the radiologist.   Official radiology report(s): CT Soft Tissue Neck W Contrast  Result Date: 06/05/2020 CLINICAL DATA:  Swelling of the throat neck. Recent left tonsillitis. Symptoms persisting in worsened. EXAM: CT NECK WITH CONTRAST TECHNIQUE: Multidetector CT imaging of the neck was performed using the standard protocol following the bolus administration of intravenous contrast. CONTRAST:  77mL OMNIPAQUE IOHEXOL 300 MG/ML  SOLN COMPARISON:  08/05/2009 FINDINGS: Pharynx and larynx: 4 cm in diameter left peritonsillar abscess. Edematous inflammation in the parapharyngeal space of a mild degree. No evidence of distant abscess. No other mucosal or submucosal finding. Salivary glands: Parotid and submandibular glands are normal. Thyroid: Normal Lymph nodes: No enlarged or low-density nodes on either side of the neck. Vascular: Normal Limited intracranial: Normal Visualized orbits: Normal Mastoids and visualized paranasal sinuses: Clear Skeleton: Cervical spondylosis C5-6. Upper chest: Normal Other: None IMPRESSION: 4 cm in diameter left peritonsillar abscess. Edematous inflammation in the parapharyngeal space of a mild degree. No evidence of distant abscess. Electronically Signed   By: Paulina Fusi M.D.   On: 06/05/2020 13:58     ____________________________________________   PROCEDURES  Procedure(s) performed (including Critical Care):  Procedures   ____________________________________________   INITIAL IMPRESSION / ASSESSMENT AND PLAN / ED COURSE  As part of Arthur medical decision making, I reviewed the following data within the electronic MEDICAL RECORD NUMBER Notes from prior ED visits and [place] Controlled Substance Database  39 year old male presents to the ED with complaint of left-sided throat pain.  He was seen 4 days ago in the emergency department at [facility] and diagnosed with a tonsil infection.  Patient has been taking Augmentin and a Medrol Dosepak since his discharge.  On today's exam there is a left-sided peritonsillar abscess with deviation of the uvula to the right.  Patient is able to maintain his own secretions and is able to talk and give a history.  Lab work was reviewed.  CT scan shows that the abscess has enlarged  to 4 cm.  Dr. Willeen Cass who is on-call for ENT will be over to see the patient in the emergency department.  Patient is aware and is NPO.  He was given Unasyn 3 g IV and Decadron 10  mg IV.  Patient tolerated Dr. Talmage Nap procedure well.  Within an hour after I&D was performed patient had drank 2 cups of fluids without any difficulty and was able to swallow hydrocodone.  Patient is encouraged to drink fluids frequently and to stay with soft foods.  He is to continue with Augmentin and an additional 10 days of Augmentin with this along with a prescription for prednisone starting back with 60 mg and tapering down.  A prescription for Norco was sent to his pharmacy.  He is aware that he needs to return to the emergency department if any severe worsening of his symptoms.  He will follow-up in Dr. Talmage Nap office next week.  Patient was talking and improved prior to discharge. ____________________________________________   FINAL CLINICAL IMPRESSION(S) / ED DIAGNOSES  Final diagnoses:   Peritonsillar abscess     ED Discharge Orders         Ordered    amoxicillin-clavulanate (AUGMENTIN) 875-125 MG tablet  2 times daily        06/05/20 1804    predniSONE (DELTASONE) 10 MG tablet        06/05/20 1804    HYDROcodone-acetaminophen (NORCO/VICODIN) 5-325 MG tablet  Every 6 hours PRN        06/05/20 1807          *Please note:  TEVEN MITTMAN was evaluated in Emergency Department on 06/05/2020 for the symptoms described in the history of present illness. He was evaluated in the context of the global COVID-19 pandemic, which necessitated consideration that the patient might be at risk for infection with the SARS-CoV-2 virus that causes COVID-19. Institutional protocols and algorithms that pertain to the evaluation of patients at risk for COVID-19 are in a state of rapid change based on information released by regulatory bodies including the CDC and federal and state organizations. These policies and algorithms were followed during the patient's care in the ED.  Some ED evaluations and interventions may be delayed as a result of limited staffing during and the pandemic.*   Note:  This document was prepared using Dragon voice recognition software and may include unintentional dictation errors.    Tommi Rumps, PA-C 06/05/20 1903    Sharman Cheek, MD 06/06/20 [phone]

## 2020-06-06 ENCOUNTER — Ambulatory Visit: Payer: BC Managed Care – PPO | Admitting: Physical Therapy

## 2021-01-14 ENCOUNTER — Ambulatory Visit
Admission: EM | Admit: 2021-01-14 | Discharge: 2021-01-14 | Disposition: A | Discharge status: Home / Self Care | Payer: Medicaid Other | Attending: Family Medicine | Admitting: Family Medicine

## 2021-01-14 ENCOUNTER — Other Ambulatory Visit: Payer: Self-pay

## 2021-01-14 DIAGNOSIS — Z20822 Contact with and (suspected) exposure to covid-19: Secondary | ICD-10-CM | POA: Insufficient documentation

## 2021-01-14 DIAGNOSIS — J029 Acute pharyngitis, unspecified: Secondary | ICD-10-CM | POA: Diagnosis not present

## 2021-01-14 LAB — RESP PANEL BY RT-PCR (FLU A&B, COVID) ARPGX2
Influenza A by PCR: NEGATIVE
Influenza B by PCR: NEGATIVE
SARS Coronavirus 2 by RT PCR: NEGATIVE

## 2021-01-14 LAB — GROUP A STREP BY PCR: Group A Strep by PCR: NOT DETECTED

## 2021-01-14 MED ORDER — AMOXICILLIN-POT CLAVULANATE 875-125 MG PO TABS: 1.0000 | ORAL_TABLET | Freq: Two times a day (BID) | ORAL | 0 refills | Status: DC

## 2021-01-14 MED ORDER — KETOROLAC TROMETHAMINE 10 MG PO TABS: 10.0000 mg | ORAL_TABLET | Freq: Four times a day (QID) | ORAL | 0 refills | Status: DC | PRN

## 2021-01-14 NOTE — ED Provider Notes (Signed)
[facility]    CSN: [id number] Arrival date & time: 01/14/21  1226      History   Chief Complaint Chief Complaint  Patient presents with  . Sore Throat   HPI  40 year old male presents with sore throat, fever, body aches.  Started on Friday.  He reports fever, T-max 101.  Also reports severe sore throat and body aches.  Denies cough.  He rates his pain as 7/10 in severity.  No relieving factors.  No other reported symptoms.  No other complaints.  Home Medications    Prior to Admission medications   Medication Sig Start Date End Date Taking? Authorizing Provider  amoxicillin-clavulanate (AUGMENTIN) 875-125 MG tablet Take 1 tablet by mouth 2 (two) times daily. 01/14/21  Yes Darolyn Double G, DO  ketorolac (TORADOL) 10 MG tablet Take 1 tablet (10 mg total) by mouth every 6 (six) hours as needed for moderate pain or severe pain. 01/14/21  Yes Tommie Sams, DO   Allergies   Patient has no known allergies.   Review of Systems Review of Systems Per HPI  Physical Exam Triage Vital Signs ED Triage Vitals  Enc Vitals Group     BP 01/14/21 1318 123/79     Pulse Rate 01/14/21 1318 74     Resp 01/14/21 1318 18     Temp 01/14/21 1318 99.6 F (37.6 C)     Temp Source 01/14/21 1318 Oral     SpO2 01/14/21 1318 99 %     Weight 01/14/21 1318 185 lb (83.9 kg)     Height --      Head Circumference --      Peak Flow --      Pain Score 01/14/21 1317 7     Pain Loc --      Pain Edu? --      Excl. in GC? --    No data found.  Updated Vital Signs BP 123/79 (BP Location: Left Arm)   Pulse 74   Temp 99.6 F (37.6 C) (Oral)   Resp 18   Wt 83.9 kg   SpO2 99%   BMI 27.32 kg/m   Visual Acuity Right Eye Distance:   Left Eye Distance:   Bilateral Distance:    Right Eye Near:   Left Eye Near:    Bilateral Near:     Physical Exam Vitals and nursing note reviewed.  Constitutional:      Appearance: He is ill-appearing.  HENT:     Head: Normocephalic and  atraumatic.     Right Ear: Tympanic membrane normal.     Left Ear: Tympanic membrane normal.     Mouth/Throat:     Pharynx: Posterior oropharyngeal erythema present. No oropharyngeal exudate.  Eyes:     General:        Right eye: No discharge.        Left eye: No discharge.     Conjunctiva/sclera: Conjunctivae normal.  Cardiovascular:     Rate and Rhythm: Normal rate and regular rhythm.     Heart sounds: No murmur heard.   Pulmonary:     Effort: Pulmonary effort is normal.     Breath sounds: Normal breath sounds. No wheezing, rhonchi or rales.  Neurological:     Mental Status: He is alert.    UC Treatments / Results  Labs (all labs ordered are listed, but only abnormal results are displayed) Labs Reviewed  RESP PANEL BY RT-PCR (FLU A&B, COVID) [id number]  GROUP A STREP BY  PCR    EKG   Radiology No results found.  Procedures Procedures (including critical care time)  Medications Ordered in UC Medications - No data to display  Initial Impression / Assessment and Plan / UC Course  I have reviewed the triage vital signs and the nursing notes.  Pertinent labs & imaging results that were available during my care of the patient were reviewed by me and considered in my medical decision making (see chart for details).    40 year old male presents with acute pharyngitis.  Acute illness with systemic symptoms given the fever.  Flu negative.  COVID-negative.  Strep negative.  Given persistent symptoms and lack of improvement, I am placing him on Augmentin.  Toradol for body aches.  Supportive care.  Final Clinical Impressions(s) / UC Diagnoses   Final diagnoses:  Acute pharyngitis, unspecified etiology     Discharge Instructions     Medication as prescribed.  Flu, strep, COVID testing negative.  Take care  Dr. Adriana Simas    ED Prescriptions    Medication Sig Dispense Auth. Provider   amoxicillin-clavulanate (AUGMENTIN) 875-125 MG tablet Take 1 tablet by mouth 2 (two)  times daily. 20 tablet Peregrine Nolt G, DO   ketorolac (TORADOL) 10 MG tablet Take 1 tablet (10 mg total) by mouth every 6 (six) hours as needed for moderate pain or severe pain. 20 tablet Tommie Sams, DO     PDMP not reviewed this encounter.   Tommie Sams, [place] 01/14/21 1505

## 2021-01-14 NOTE — Discharge Instructions (Signed)
Medication as prescribed.  Flu, strep, COVID testing negative.  Take care  Dr. Adriana Simas

## 2021-01-14 NOTE — ED Triage Notes (Signed)
Pt c/o sore throat, fever, body aches since friday

## 2021-02-18 ENCOUNTER — Other Ambulatory Visit: Payer: Self-pay

## 2021-02-18 ENCOUNTER — Emergency Department: Payer: Medicaid Other

## 2021-02-18 ENCOUNTER — Emergency Department
Admission: EM | Admit: 2021-02-18 | Discharge: 2021-02-18 | Disposition: A | Discharge status: Home / Self Care | Payer: Medicaid Other | Attending: Emergency Medicine | Admitting: Emergency Medicine

## 2021-02-18 DIAGNOSIS — R52 Pain, unspecified: Secondary | ICD-10-CM

## 2021-02-18 DIAGNOSIS — S30860A Insect bite (nonvenomous) of lower back and pelvis, initial encounter: Secondary | ICD-10-CM | POA: Diagnosis present

## 2021-02-18 DIAGNOSIS — K921 Melena: Secondary | ICD-10-CM | POA: Diagnosis not present

## 2021-02-18 DIAGNOSIS — R202 Paresthesia of skin: Secondary | ICD-10-CM | POA: Insufficient documentation

## 2021-02-18 DIAGNOSIS — W57XXXA Bitten or stung by nonvenomous insect and other nonvenomous arthropods, initial encounter: Secondary | ICD-10-CM | POA: Insufficient documentation

## 2021-02-18 DIAGNOSIS — M791 Myalgia, unspecified site: Secondary | ICD-10-CM | POA: Diagnosis not present

## 2021-02-18 DIAGNOSIS — R079 Chest pain, unspecified: Secondary | ICD-10-CM | POA: Diagnosis not present

## 2021-02-18 DIAGNOSIS — D692 Other nonthrombocytopenic purpura: Secondary | ICD-10-CM | POA: Diagnosis not present

## 2021-02-18 LAB — BASIC METABOLIC PANEL
Anion gap: 7 (ref 5–15)
BUN: 12 mg/dL (ref 6–20)
CO2: 29 mmol/L (ref 22–32)
Calcium: 9.6 mg/dL (ref 8.9–10.3)
Chloride: 105 mmol/L (ref 98–111)
Creatinine, Ser: 0.84 mg/dL (ref 0.61–1.24)
GFR, Estimated: >60 mL/min (ref >60)
Glucose, Bld: 112 mg/dL — ABNORMAL HIGH (ref 70–99)
Potassium: 3.2 mmol/L — ABNORMAL LOW (ref 3.5–5.1)
Sodium: 141 mmol/L (ref 135–145)

## 2021-02-18 LAB — URINALYSIS, COMPLETE (UACMP) WITH MICROSCOPIC
Bacteria, UA: NONE SEEN
Bilirubin Urine: NEGATIVE
Glucose, UA: NEGATIVE mg/dL
Hgb urine dipstick: NEGATIVE
Ketones, ur: NEGATIVE mg/dL
Leukocytes,Ua: NEGATIVE
Nitrite: NEGATIVE
Protein, ur: NEGATIVE mg/dL
Specific Gravity, Urine: 1.012 (ref 1.005–1.030)
Squamous Epithelial / HPF: NONE SEEN (ref 0–5)
pH: 6 (ref 5.0–8.0)

## 2021-02-18 LAB — HEPATIC FUNCTION PANEL
ALT: 24 U/L (ref 0–44)
AST: 24 U/L (ref 15–41)
Albumin: 4.5 g/dL (ref 3.5–5.0)
Alkaline Phosphatase: 56 U/L (ref 38–126)
Bilirubin, Direct: <0.1 mg/dL (ref 0.0–0.2)
Total Bilirubin: 0.8 mg/dL (ref 0.3–1.2)
Total Protein: 7.3 g/dL (ref 6.5–8.1)

## 2021-02-18 LAB — CBC
HCT: 41.7 % (ref 39.0–52.0)
Hemoglobin: 14.3 g/dL (ref 13.0–17.0)
MCH: 29.3 pg (ref 26.0–34.0)
MCHC: 34.3 g/dL (ref 30.0–36.0)
MCV: 85.5 fL (ref 80.0–100.0)
Platelets: 232 10*3/uL (ref 150–400)
RBC: 4.88 MIL/uL (ref 4.22–5.81)
RDW: 13.3 % (ref 11.5–15.5)
WBC: 7.7 10*3/uL (ref 4.0–10.5)
nRBC: 0 % (ref 0.0–0.2)

## 2021-02-18 LAB — TROPONIN I (HIGH SENSITIVITY)
Troponin I (High Sensitivity): 3 ng/L (ref <18)
Troponin I (High Sensitivity): 3 ng/L (ref <18)

## 2021-02-18 LAB — LIPASE, BLOOD: Lipase: 57 U/L — ABNORMAL HIGH (ref 11–51)

## 2021-02-18 MED ORDER — DOXYCYCLINE HYCLATE 100 MG PO TABS
100.0000 mg | ORAL_TABLET | Freq: Once | ORAL | Status: AC
  Administered 2021-02-18: 100 mg via ORAL
  Filled 2021-02-18: qty 1

## 2021-02-18 MED ORDER — DOXYCYCLINE MONOHYDRATE 100 MG PO CAPS: 100.0000 mg | ORAL_CAPSULE | Freq: Two times a day (BID) | ORAL | 0 refills | Status: AC

## 2021-02-18 MED ORDER — SODIUM CHLORIDE 0.9 % IV BOLUS
1000.0000 mL | Freq: Once | INTRAVENOUS | Status: AC
  Administered 2021-02-18: 1000 mL via INTRAVENOUS

## 2021-02-18 MED ORDER — POTASSIUM CHLORIDE CRYS ER 20 MEQ PO TBCR
20.0000 meq | EXTENDED_RELEASE_TABLET | Freq: Once | ORAL | Status: AC
  Administered 2021-02-18: 20 meq via ORAL
  Filled 2021-02-18: qty 1

## 2021-02-18 NOTE — Discharge Instructions (Addendum)
If your [place] fever test is positive you only need to take the doxy for 10 days If the lymes test is positive please complete the 28 day course Keep sunscreen on as doxycycline will cause you to burn easily in the sun

## 2021-02-18 NOTE — ED Notes (Signed)
Red and SST tubes collected just in case MD wants to check for [facility] spotted tick or anything other send out test.

## 2021-02-18 NOTE — ED Triage Notes (Addendum)
Pt comes with c/o generalized body aches. Pt states fever last night. Pt states pain in left mid side of back. Pt states left neck and jaw pain.  Pt states this all started yesterday.  Pt states about 14 days ago he got bit by a tick. Pt states rash to buttocks following the bite. Pt also states some chest pain in left upper. Pt states pain left shoulder and ankle as well. Pt states he just feel so weak and tired but he hasn't done anything.  Pt also states dark colored stool that has been going on for months.

## 2021-02-18 NOTE — ED Provider Notes (Signed)
[facility] Emergency Department Provider Note  ____________________________________________   Event Date/Time   First MD Initiated Contact with Patient 02/18/21 1705     (approximate)  I have reviewed the triage vital signs and the nursing notes.   HISTORY  Chief Complaint Generalized Body Aches    HPI Arthur Gonzalez is a 40 y.o. male presents emergency department stating he has not felt well for approximately 10 days and has been running a low-grade temp for the past 10 days.  Patient pulled a tick off of him approximately 14 days ago.  His wife states he did have a petechial type rash on the lower extremities which has gotten better.  He states he has some left-sided chest pain and some tingling.  The tingling was originally in the lower legs and is now in the upper extremities.  The left upper chest pain/back pain started yesterday.  Also states he has had dark-colored stools but has history of IBS.  History reviewed. No pertinent past medical history.  There are no problems to display for this patient.   History reviewed. No pertinent surgical history.  Prior to Admission medications   Medication Sig Start Date End Date Taking? Authorizing Provider  doxycycline (MONODOX) 100 MG capsule Take 1 capsule (100 mg total) by mouth 2 (two) times daily for 28 days. 02/18/21 03/18/21 Yes Sander Speckman, Roselyn Bering, PA-C    Allergies Patient has no known allergies.  No family history on file.  Social History    Review of Systems  Constitutional: Positive fever/chills Eyes: No visual changes. ENT: No sore throat. Respiratory: Denies cough Cardiovascular: Positive chest pain Gastrointestinal: Positive abdominal pain Genitourinary: Negative for dysuria. Musculoskeletal: Negative for back pain. Skin: Negative for rash. Psychiatric: no mood changes,     ____________________________________________   PHYSICAL EXAM:  VITAL SIGNS: ED Triage Vitals  Enc  Vitals Group     BP 02/18/21 1621 (!) 145/94     Pulse Rate 02/18/21 1621 69     Resp 02/18/21 1621 18     Temp 02/18/21 1621 97.9 F (36.6 C)     Temp src --      SpO2 02/18/21 1621 100 %     Weight --      Height --      Head Circumference --      Peak Flow --      Pain Score 02/18/21 1617 10     Pain Loc --      Pain Edu? --      Excl. in GC? --     Constitutional: Alert and oriented. Well appearing and in no acute distress. Eyes: Conjunctivae are normal.  Head: Atraumatic. Nose: No congestion/rhinnorhea. Mouth/Throat: Mucous membranes are moist.   Neck:  supple no lymphadenopathy noted Cardiovascular: Normal rate, regular rhythm. Heart sounds are normal Respiratory: Normal respiratory effort.  No retractions, lungs c t a  Abd: soft nontender bs normal all 4 quad GU: deferred Musculoskeletal: FROM all extremities, warm and well perfused, slight pink rash noted on the lower extremities Neurologic:  Normal speech and language.  Skin:  Skin is warm, dry and intact. No rash noted. Psychiatric: Mood and affect are normal. Speech and behavior are normal.  ____________________________________________   LABS (all labs ordered are listed, but only abnormal results are displayed)  Labs Reviewed  BASIC METABOLIC PANEL - Abnormal; Notable for the following components:      Result Value   Potassium 3.2 (*)    Glucose,  Bld 112 (*)    All other components within normal limits  LIPASE, BLOOD - Abnormal; Notable for the following components:   Lipase 57 (*)    All other components within normal limits  URINALYSIS, COMPLETE (UACMP) WITH MICROSCOPIC - Abnormal; Notable for the following components:   Color, Urine YELLOW (*)    APPearance CLEAR (*)    All other components within normal limits  CBC  HEPATIC FUNCTION PANEL  ROCKY MTN SPOTTED FVR ABS PNL(IGG+IGM)  LYME DISEASE, WESTERN BLOT  TROPONIN I (HIGH SENSITIVITY)  TROPONIN I (HIGH SENSITIVITY)    ____________________________________________   ____________________________________________  RADIOLOGY  Chest x-ray  ____________________________________________   PROCEDURES  Procedure(s) performed: EKG   Procedures    ____________________________________________   INITIAL IMPRESSION / ASSESSMENT AND PLAN / ED COURSE  Pertinent labs & imaging results that were available during my care of the patient were reviewed by me and considered in my medical decision making (see chart for details).   Patient is a 39 year old male presents emergency department with left-sided chest pain, sick bite, not feeling well for several days.  See HPI.  Physical exam shows patient appears to  DDx: [facility] spotted fever, Lyme's, MI, pancreatitis, IBS, GI bleed  CBC is normal, basic metabolic panel shows decreased potassium 3.2, remainder is normal, troponin is normal, hepatic functions normal, urinalysis normal, lipase is minimally elevated at 57  X-ray reviewed by me confirmed by radiology to be normal  EKG shows normal sinus rhythm, see physician read  The patient feels a little better after a liter of fluids.  Due to the tick bite with a petechial type rash feel patient most likely has [facility] spotted fever.  Place him on doxycycline.  He is to follow-up with his regular doctor if not improving to 3 days.  Return emergency department worsening.  He is discharged stable condition in the care of his wife.  Arthur Gonzalez was evaluated in Emergency Department on 02/18/2021 for the symptoms described in the history of present illness. He was evaluated in the context of the global COVID-19 pandemic, which necessitated consideration that the patient might be at risk for infection with the SARS-CoV-2 virus that causes COVID-19. Institutional protocols and algorithms that pertain to the evaluation of patients at risk for COVID-19 are in a state of rapid change based on information released by  regulatory bodies including the CDC and federal and state organizations. These policies and algorithms were followed during the patient's care in the ED.    As part of my medical decision making, I reviewed the following data within the electronic MEDICAL RECORD NUMBER History obtained from family, Nursing notes reviewed and incorporated, Labs reviewed , EKG interpreted NSR, Old chart reviewed, Radiograph reviewed , Notes from prior ED visits, and [place] Controlled Substance Database  ____________________________________________   FINAL CLINICAL IMPRESSION(S) / ED DIAGNOSES  Final diagnoses:  Tick bite of buttock, initial encounter  Body aches      NEW MEDICATIONS STARTED DURING THIS VISIT:  New Prescriptions   DOXYCYCLINE (MONODOX) 100 MG CAPSULE    Take 1 capsule (100 mg total) by mouth 2 (two) times daily for 28 days.     Note:  This document was prepared using Dragon voice recognition software and may include unintentional dictation errors.    Faythe Ghee, PA-C 02/18/21 1919    Gilles Chiquito, MD 02/19/21 [phone]

## 2021-02-18 NOTE — ED Notes (Signed)
Pt states that he hasn't felt well for the past 10 days, pt states that he has ran a low grade temp for the past 10 days. Pt states that he had a tick on him for 14 days ago, pt is c/o feeling weak, left sided weakness and tingling for days. Left sided chest pain. No distress noted

## 2021-02-21 LAB — ROCKY MTN SPOTTED FVR ABS PNL(IGG+IGM)
RMSF IgG: NEGATIVE
RMSF IgM: 0.56 index (ref 0.00–0.89)

## 2021-03-02 ENCOUNTER — Other Ambulatory Visit: Payer: Self-pay

## 2021-03-02 ENCOUNTER — Ambulatory Visit
Admission: EM | Admit: 2021-03-02 | Discharge: 2021-03-02 | Disposition: A | Discharge status: Home / Self Care | Payer: Medicaid Other | Attending: Physician Assistant | Admitting: Physician Assistant

## 2021-03-02 ENCOUNTER — Encounter: Payer: Self-pay | Admitting: Emergency Medicine

## 2021-03-02 DIAGNOSIS — Z20822 Contact with and (suspected) exposure to covid-19: Secondary | ICD-10-CM | POA: Insufficient documentation

## 2021-03-02 DIAGNOSIS — J029 Acute pharyngitis, unspecified: Secondary | ICD-10-CM | POA: Insufficient documentation

## 2021-03-02 DIAGNOSIS — Z28310 Unvaccinated for covid-19: Secondary | ICD-10-CM | POA: Diagnosis not present

## 2021-03-02 LAB — SARS CORONAVIRUS 2 (TAT 6-24 HRS): SARS Coronavirus 2: NEGATIVE

## 2021-03-02 LAB — POCT RAPID STREP A: Streptococcus, Group A Screen (Direct): NEGATIVE

## 2021-03-02 MED ORDER — LIDOCAINE VISCOUS HCL 2 % MT SOLN: 15.0000 mL | OROMUCOSAL | 0 refills | Status: DC | PRN

## 2021-03-02 MED ORDER — KETOROLAC TROMETHAMINE 10 MG PO TABS: 10.0000 mg | ORAL_TABLET | Freq: Four times a day (QID) | ORAL | 0 refills | Status: DC | PRN

## 2021-03-02 NOTE — ED Triage Notes (Signed)
Patient c/o sore throat, neck pain, and swollen lymph nodes that started on Friday.  Patient reports low grade fevers.  Patient is currently on Doxycycline for Lyme Disease.

## 2021-03-02 NOTE — Discharge Instructions (Addendum)
Rapid strep test is negative.  We will send culture and the result be back in 2 to 3 days.  If culture is positive we will put you on antibiotics but you are already on an antibiotic for a month and if you are put on another 1 it can increase your risk for infectious diarrhea which is oftentimes hard to treat.  Increase rest and fluids.  I have sent Toradol to help with the throat pain and achiness.  Have also sent in viscous lidocaine.  This is likely a viral illness and will take about a week or so to run its course but if the culture is positive we will send in amoxicillin or azithromycin.  You have received COVID testing today either for positive exposure, concerning symptoms that could be related to COVID infection, screening purposes, or re-testing after confirmed positive.  Your test obtained today checks for active viral infection in the last 1-2 weeks. If your test is negative now, you can still test positive later. So, if you do develop symptoms you should either get re-tested and/or isolate x 5 days and then strict mask use x 5 days (unvaccinated) or mask use x 10 days (vaccinated). Please follow CDC guidelines.  While Rapid antigen tests come back in 15-20 minutes, send out PCR/molecular test results typically come back within 1-3 days. In the mean time, if you are symptomatic, assume this could be a positive test and treat/monitor yourself as if you do have COVID.   We will call with test results if positive. Please download the MyChart app and set up a profile to access test results.   If symptomatic, go home and rest. Push fluids. Take Tylenol as needed for discomfort. Gargle warm salt water. Throat lozenges. Take Mucinex DM or Robitussin for cough. Humidifier in bedroom to ease coughing. Warm showers. Also review the COVID handout for more information.  COVID-19 INFECTION: The incubation period of COVID-19 is approximately 14 days after exposure, with most symptoms developing in roughly  4-5 days. Symptoms may range in severity from mild to critically severe. Roughly 80% of those infected will have mild symptoms. People of any age may become infected with COVID-19 and have the ability to transmit the virus. The most common symptoms include: fever, fatigue, cough, body aches, headaches, sore throat, nasal congestion, shortness of breath, nausea, vomiting, diarrhea, changes in smell and/or taste.    COURSE OF ILLNESS Some patients may begin with mild disease which can progress quickly into critical symptoms. If your symptoms are worsening please call ahead to the Emergency Department and proceed there for further treatment. Recovery time appears to be roughly 1-2 weeks for mild symptoms and 3-6 weeks for severe disease.   GO IMMEDIATELY TO ER FOR FEVER YOU ARE UNABLE TO GET DOWN WITH TYLENOL, BREATHING PROBLEMS, CHEST PAIN, FATIGUE, LETHARGY, INABILITY TO EAT OR DRINK, ETC  QUARANTINE AND ISOLATION: To help decrease the spread of COVID-19 please remain isolated if you have COVID infection or are highly suspected to have COVID infection. This means -stay home and isolate to one room in the home if you live with others. Do not share a bed or bathroom with others while ill, sanitize and wipe down all countertops and keep common areas clean and disinfected. Stay home for 5 days. If you have no symptoms or your symptoms are resolving after 5 days, you can leave your house. Continue to wear a mask around others for 5 additional days. If you have been in close  contact (within 6 feet) of someone diagnosed with COVID 19, you are advised to quarantine in your home for 14 days as symptoms can develop anywhere from 2-14 days after exposure to the virus. If you develop symptoms, you  must isolate.  Most current guidelines for COVID after exposure -unvaccinated: isolate 5 days and strict mask use x 5 days. Test on day 5 is possible -vaccinated: wear mask x 10 days if symptoms do not develop -You do not  necessarily need to be tested for COVID if you have + exposure and  develop symptoms. Just isolate at home x10 days from symptom onset During this global pandemic, CDC advises to practice social distancing, try to stay at least 29ft away from others at all times. Wear a face covering. Wash and sanitize your hands regularly and avoid going anywhere that is not necessary.  KEEP IN MIND THAT THE COVID TEST IS NOT 100% ACCURATE AND YOU SHOULD STILL DO EVERYTHING TO PREVENT POTENTIAL SPREAD OF VIRUS TO OTHERS (WEAR MASK, WEAR GLOVES, WASH HANDS AND SANITIZE REGULARLY). IF INITIAL TEST IS NEGATIVE, THIS MAY NOT MEAN YOU ARE DEFINITELY NEGATIVE. MOST ACCURATE TESTING IS DONE 5-7 DAYS AFTER EXPOSURE.   It is not advised by CDC to get re-tested after receiving a positive COVID test since you can still test positive for weeks to months after you have already cleared the virus.   *If you have not been vaccinated for COVID, I strongly suggest you consider getting vaccinated as long as there are no contraindications.

## 2021-03-02 NOTE — ED Provider Notes (Signed)
[facility]    CSN: [id number] Arrival date & time: 03/02/21  1118      History   Chief Complaint Chief Complaint  Patient presents with   Sore Throat    HPI Arthur Gonzalez is a 40 y.o. male presenting for 2-day history of sore throat and painful swallowing, posterior neck pain and swollen lymph nodes.  He also says that he has had temperatures up to 99 degrees.  He denies any high-grade fever, cough, congestion, chest discomfort, breathing difficulty, nausea/vomiting or diarrhea.  Patient did reportedly go to the ED on 02/18/2021 for concerns about a tick bite and some low-grade fevers and achiness.  He has been started on doxycycline for 28-day course.  Patient says he is about halfway through.  He does report drinking plenty of water whenever he takes medication.  Patient denies known COVID exposure.  Not vaccinated for COVID-19.  No other concerns.  HPI  History reviewed. No pertinent past medical history.  There are no problems to display for this patient.   History reviewed. No pertinent surgical history.     Home Medications    Prior to Admission medications   Medication Sig Start Date End Date Taking? Authorizing Provider  doxycycline (MONODOX) 100 MG capsule Take 1 capsule (100 mg total) by mouth 2 (two) times daily for 28 days. 02/18/21 03/18/21 Yes Fisher, Roselyn Bering, PA-C  ketorolac (TORADOL) 10 MG tablet Take 1 tablet (10 mg total) by mouth every 6 (six) hours as needed. 03/02/21  Yes Eusebio Friendly B, PA-C  lidocaine (XYLOCAINE) 2 % solution Use as directed 15 mLs in the mouth or throat every 3 (three) hours as needed for mouth pain (swish and spit). 03/02/21  Yes Shirlee Latch PA-C    Family History History reviewed. No pertinent family history.  Social History Social History   Tobacco Use   Smoking status: Former    Pack years: 0.00    Types: Cigarettes   Smokeless tobacco: Never  Vaping Use   Vaping Use: Every day  Substance Use Topics    Alcohol use: Never   Drug use: Never     Allergies   Patient has no known allergies.   Review of Systems Review of Systems  Constitutional:  Positive for fatigue. Negative for fever.  HENT:  Positive for sore throat. Negative for congestion, rhinorrhea, sinus pressure and sinus pain.   Respiratory:  Negative for cough and shortness of breath.   Gastrointestinal:  Negative for abdominal pain, diarrhea, nausea and vomiting.  Musculoskeletal:  Positive for neck pain. Negative for myalgias.  Neurological:  Negative for weakness, light-headedness and headaches.  Hematological:  Positive for adenopathy.    Physical Exam Triage Vital Signs ED Triage Vitals  Enc Vitals Group     BP 03/02/21 1148 131/80     Pulse Rate 03/02/21 1148 77     Resp 03/02/21 1148 16     Temp 03/02/21 1148 98.7 F (37.1 C)     Temp Source 03/02/21 1148 Oral     SpO2 03/02/21 1148 99 %     Weight 03/02/21 1146 190 lb (86.2 kg)     Height 03/02/21 1146 5\' 9"  (1.753 m)     Head Circumference --      Peak Flow --      Pain Score 03/02/21 1146 7     Pain Loc --      Pain Edu? --      Excl. in GC? --  No data found.  Updated Vital Signs BP 131/80 (BP Location: Left Arm)   Pulse 77   Temp 98.7 F (37.1 C) (Oral)   Resp 16   Ht 5\' 9"  (1.753 m)   Wt 190 lb (86.2 kg)   SpO2 99%   BMI 28.06 kg/m      Physical Exam Vitals and nursing note reviewed.  Constitutional:      General: He is not in acute distress.    Appearance: Normal appearance. He is well-developed. He is not ill-appearing.  HENT:     Head: Normocephalic and atraumatic.     Right Ear: Tympanic membrane, ear canal and external ear normal.     Left Ear: Tympanic membrane, ear canal and external ear normal.     Nose: Nose normal.     Mouth/Throat:     Mouth: Mucous membranes are moist.     Pharynx: Oropharynx is clear. Posterior oropharyngeal erythema present.  Eyes:     General: No scleral icterus.    Conjunctiva/sclera:  Conjunctivae normal.  Cardiovascular:     Rate and Rhythm: Normal rate and regular rhythm.     Heart sounds: Normal heart sounds.  Pulmonary:     Effort: Pulmonary effort is normal. No respiratory distress.     Breath sounds: Normal breath sounds.  Musculoskeletal:     Cervical back: Neck supple.  Skin:    General: Skin is warm and dry.  Neurological:     General: No focal deficit present.     Mental Status: He is alert. Mental status is at baseline.     Motor: No weakness.     Gait: Gait normal.  Psychiatric:        Mood and Affect: Mood normal.        Behavior: Behavior normal.        Thought Content: Thought content normal.     UC Treatments / Results  Labs (all labs ordered are listed, but only abnormal results are displayed) Labs Reviewed  CULTURE, GROUP A STREP ([facility])  SARS CORONAVIRUS 2 (TAT 6-24 HRS)  POCT RAPID STREP A, ED / UC  POCT RAPID STREP A    EKG   Radiology No results found.  Procedures Procedures (including critical care time)  Medications Ordered in UC Medications - No data to display  Initial Impression / Assessment and Plan / UC Course  I have reviewed the triage vital signs and the nursing notes.  Pertinent labs & imaging results that were available during my care of the patient were reviewed by me and considered in my medical decision making (see chart for details).  40 year old male presenting for sore throat, neck pain, swollen lymph nodes and fatigue x2 days.  Vital signs are all normal and stable and he is overall well-appearing.  Exam significant for mild to moderate posterior pharyngeal erythema.  The remainder the exam is within normal limits.  Rapid strep test is negative.  We will send for culture.  COVID test also obtained.  Current CDC guidelines, isolation protocol and ED precautions for COVID-19 infection reviewed with patient.  I have sent in ketorolac since it helped him last time he had a sore throat and also viscous  lidocaine.  Advised patient if culture is positive we can send in azithromycin or amoxicillin but would like to hold off on any antibiotics at this time since he is already on doxycycline for a month and was on Augmentin immediately before that.  Advised him that long-term antibiotic  use especially these antibiotics can cause C. difficile.  ED precautions reviewed.   Final Clinical Impressions(s) / UC Diagnoses   Final diagnoses:  Sore throat     Discharge Instructions      Rapid strep test is negative.  We will send culture and the result be back in 2 to 3 days.  If culture is positive we will put you on antibiotics but you are already on an antibiotic for a month and if you are put on another 1 it can increase your risk for infectious diarrhea which is oftentimes hard to treat.  Increase rest and fluids.  I have sent Toradol to help with the throat pain and achiness.  Have also sent in viscous lidocaine.  This is likely a viral illness and will take about a week or so to run its course but if the culture is positive we will send in amoxicillin or azithromycin.  You have received COVID testing today either for positive exposure, concerning symptoms that could be related to COVID infection, screening purposes, or re-testing after confirmed positive.  Your test obtained today checks for active viral infection in the last 1-2 weeks. If your test is negative now, you can still test positive later. So, if you do develop symptoms you should either get re-tested and/or isolate x 5 days and then strict mask use x 5 days (unvaccinated) or mask use x 10 days (vaccinated). Please follow CDC guidelines.  While Rapid antigen tests come back in 15-20 minutes, send out PCR/molecular test results typically come back within 1-3 days. In the mean time, if you are symptomatic, assume this could be a positive test and treat/monitor yourself as if you do have COVID.   We will call with test results if positive.  Please download the MyChart app and set up a profile to access test results.   If symptomatic, go home and rest. Push fluids. Take Tylenol as needed for discomfort. Gargle warm salt water. Throat lozenges. Take Mucinex DM or Robitussin for cough. Humidifier in bedroom to ease coughing. Warm showers. Also review the COVID handout for more information.  COVID-19 INFECTION: The incubation period of COVID-19 is approximately 14 days after exposure, with most symptoms developing in roughly 4-5 days. Symptoms may range in severity from mild to critically severe. Roughly 80% of those infected will have mild symptoms. People of any age may become infected with COVID-19 and have the ability to transmit the virus. The most common symptoms include: fever, fatigue, cough, body aches, headaches, sore throat, nasal congestion, shortness of breath, nausea, vomiting, diarrhea, changes in smell and/or taste.    COURSE OF ILLNESS Some patients may begin with mild disease which can progress quickly into critical symptoms. If your symptoms are worsening please call ahead to the Emergency Department and proceed there for further treatment. Recovery time appears to be roughly 1-2 weeks for mild symptoms and 3-6 weeks for severe disease.   GO IMMEDIATELY TO ER FOR FEVER YOU ARE UNABLE TO GET DOWN WITH TYLENOL, BREATHING PROBLEMS, CHEST PAIN, FATIGUE, LETHARGY, INABILITY TO EAT OR DRINK, ETC  QUARANTINE AND ISOLATION: To help decrease the spread of COVID-19 please remain isolated if you have COVID infection or are highly suspected to have COVID infection. This means -stay home and isolate to one room in the home if you live with others. Do not share a bed or bathroom with others while ill, sanitize and wipe down all countertops and keep common areas clean and disinfected. Stay home  for 5 days. If you have no symptoms or your symptoms are resolving after 5 days, you can leave your house. Continue to wear a mask around others for  5 additional days. If you have been in close contact (within 6 feet) of someone diagnosed with COVID 19, you are advised to quarantine in your home for 14 days as symptoms can develop anywhere from 2-14 days after exposure to the virus. If you develop symptoms, you  must isolate.  Most current guidelines for COVID after exposure -unvaccinated: isolate 5 days and strict mask use x 5 days. Test on day 5 is possible -vaccinated: wear mask x 10 days if symptoms do not develop -You do not necessarily need to be tested for COVID if you have + exposure and  develop symptoms. Just isolate at home x10 days from symptom onset During this global pandemic, CDC advises to practice social distancing, try to stay at least 70ft away from others at all times. Wear a face covering. Wash and sanitize your hands regularly and avoid going anywhere that is not necessary.  KEEP IN MIND THAT THE COVID TEST IS NOT 100% ACCURATE AND YOU SHOULD STILL DO EVERYTHING TO PREVENT POTENTIAL SPREAD OF VIRUS TO OTHERS (WEAR MASK, WEAR GLOVES, WASH HANDS AND SANITIZE REGULARLY). IF INITIAL TEST IS NEGATIVE, THIS MAY NOT MEAN YOU ARE DEFINITELY NEGATIVE. MOST ACCURATE TESTING IS DONE 5-7 DAYS AFTER EXPOSURE.   It is not advised by CDC to get re-tested after receiving a positive COVID test since you can still test positive for weeks to months after you have already cleared the virus.   *If you have not been vaccinated for COVID, I strongly suggest you consider getting vaccinated as long as there are no contraindications.       ED Prescriptions     Medication Sig Dispense Auth. Provider   lidocaine (XYLOCAINE) 2 % solution Use as directed 15 mLs in the mouth or throat every 3 (three) hours as needed for mouth pain (swish and spit). 100 mL Eusebio Friendly B, PA-C   ketorolac (TORADOL) 10 MG tablet Take 1 tablet (10 mg total) by mouth every 6 (six) hours as needed. 20 tablet Gareth Morgan      PDMP not reviewed this  encounter.   Shirlee Latch, PA-C 03/02/21 1233

## 2021-03-05 LAB — CULTURE, GROUP A STREP (THRC)

## 2021-03-12 LAB — LYME DISEASE, WESTERN BLOT
IgG P18 Ab.: ABSENT
IgG P23 Ab.: ABSENT
IgG P28 Ab.: ABSENT
IgG P30 Ab.: ABSENT
IgG P39 Ab.: ABSENT
IgG P41 Ab.: ABSENT
IgG P45 Ab.: ABSENT
IgG P58 Ab.: ABSENT
IgG P66 Ab.: ABSENT
IgG P93 Ab.: ABSENT
IgM P23 Ab.: ABSENT
IgM P39 Ab.: ABSENT
IgM P41 Ab.: ABSENT
Lyme IgG Wb: NEGATIVE
Lyme IgM Wb: NEGATIVE

## 2021-04-30 ENCOUNTER — Ambulatory Visit
Admission: EM | Admit: 2021-04-30 | Discharge: 2021-04-30 | Disposition: A | Discharge status: Home / Self Care | Payer: Medicaid Other | Attending: Family Medicine | Admitting: Family Medicine

## 2021-04-30 ENCOUNTER — Other Ambulatory Visit: Payer: Self-pay

## 2021-04-30 DIAGNOSIS — J039 Acute tonsillitis, unspecified: Secondary | ICD-10-CM

## 2021-04-30 DIAGNOSIS — H6121 Impacted cerumen, right ear: Secondary | ICD-10-CM | POA: Diagnosis not present

## 2021-04-30 MED ORDER — AMOXICILLIN-POT CLAVULANATE 875-125 MG PO TABS: 1.0000 | ORAL_TABLET | Freq: Two times a day (BID) | ORAL | 0 refills | Status: AC

## 2021-04-30 NOTE — ED Provider Notes (Signed)
[facility]    CSN: [id number] Arrival date & time: 04/30/21  1302      History   Chief Complaint Chief Complaint  Patient presents with   Otalgia    HPI 40 year old male presents with sore throat, ear pain, and lymphadenopathy.  2.5-day history of symptoms.  Reports cervical lymphadenopathy, sore throat, and right ear pain.  Has a history of recurrent sore throat.  No documented fever.  Pain 6/10 in severity.  No relieving factors.  No reported sick contacts.  No other complaints.  Home Medications    Prior to Admission medications   Medication Sig Start Date End Date Taking? Authorizing Provider  amoxicillin-clavulanate (AUGMENTIN) 875-125 MG tablet Take 1 tablet by mouth 2 (two) times daily. 04/30/21  Yes Tommie Sams, DO   Social History Social History   Tobacco Use   Smoking status: Former    Types: Cigarettes   Smokeless tobacco: Never  Vaping Use   Vaping Use: Every day  Substance Use Topics   Alcohol use: Never   Drug use: Never     Allergies   Patient has no known allergies.   Review of Systems Review of Systems Per HPI  Physical Exam Triage Vital Signs ED Triage Vitals [04/30/21 1312]  Enc Vitals Group     BP (!) 141/72     Pulse Rate 93     Resp 18     Temp 98.3 F (36.8 C)     Temp Source Oral     SpO2 100 %     Weight 200 lb (90.7 kg)     Height 5\' 9"  (1.753 m)     Head Circumference      Peak Flow      Pain Score 6     Pain Loc      Pain Edu?      Excl. in GC?    Updated Vital Signs BP (!) 141/72   Pulse 93   Temp 98.3 F (36.8 C) (Oral)   Resp 18   Ht 5\' 9"  (1.753 m)   Wt 90.7 kg   SpO2 100%   BMI 29.53 kg/m   Visual Acuity Right Eye Distance:   Left Eye Distance:   Bilateral Distance:    Right Eye Near:   Left Eye Near:    Bilateral Near:     Physical Exam Vitals and nursing note reviewed.  Constitutional:      General: He is not in acute distress.    Appearance: Normal appearance. He is not  ill-appearing.  HENT:     Head: Normocephalic and atraumatic.     Ears:     Comments: Right ear -cerumen impaction noted.    Mouth/Throat:     Comments: Oropharyngeal erythema.  Bilateral tonsillar exudate. Cardiovascular:     Rate and Rhythm: Normal rate and regular rhythm.  Pulmonary:     Effort: Pulmonary effort is normal.     Breath sounds: Normal breath sounds. No wheezing, rhonchi or rales.  Neurological:     Mental Status: He is alert.     UC Treatments / Results  Labs (all labs ordered are listed, but only abnormal results are displayed) Labs Reviewed - No data to display  EKG   Radiology No results found.  Procedures Procedures (including critical care time)  Medications Ordered in UC Medications - No data to display  Initial Impression / Assessment and Plan / UC Course  I have reviewed the triage vital signs and  the nursing notes.  Pertinent labs & imaging results that were available during my care of the patient were reviewed by me and considered in my medical decision making (see chart for details).    40 year old male presents with exudative tonsillitis.  Also found to have cerumen impaction today.  Cerumen impaction resolved with lavage.  Normal TM after lavage.  Placing on Augmentin.  Advised to contact his primary care provider who is on his Medicaid card for a referral to ENT.  Final Clinical Impressions(s) / UC Diagnoses   Final diagnoses:  Exudative tonsillitis     Discharge Instructions      Antibiotic as prescribed.  Call provider on your insurance card. You need a referral to ENT.  Take care  Dr. Adriana Simas    ED Prescriptions     Medication Sig Dispense Auth. Provider   amoxicillin-clavulanate (AUGMENTIN) 875-125 MG tablet Take 1 tablet by mouth 2 (two) times daily. 20 tablet Tommie Sams, DO      PDMP not reviewed this encounter.   Tommie Sams, [place] 04/30/21 1426

## 2021-04-30 NOTE — Discharge Instructions (Addendum)
Antibiotic as prescribed.  Call provider on your insurance card. You need a referral to ENT.  Take care  Dr. Adriana Simas

## 2021-04-30 NOTE — ED Triage Notes (Signed)
Pt reports having R ear pain and swollen lymph nodes x2 days.

## 2022-05-26 IMAGING — CR DG CHEST 2V
1 series · 2 of 2 positions shown · non-contrast
Comparison: None.

CLINICAL DATA: Generalized body aches, fever and back pain.

EXAM:
CHEST - 2 VIEW

[Series 1: dg chest 2 view · 0.14mm/px · 2 of 2 slices shown]
[im 1/2]
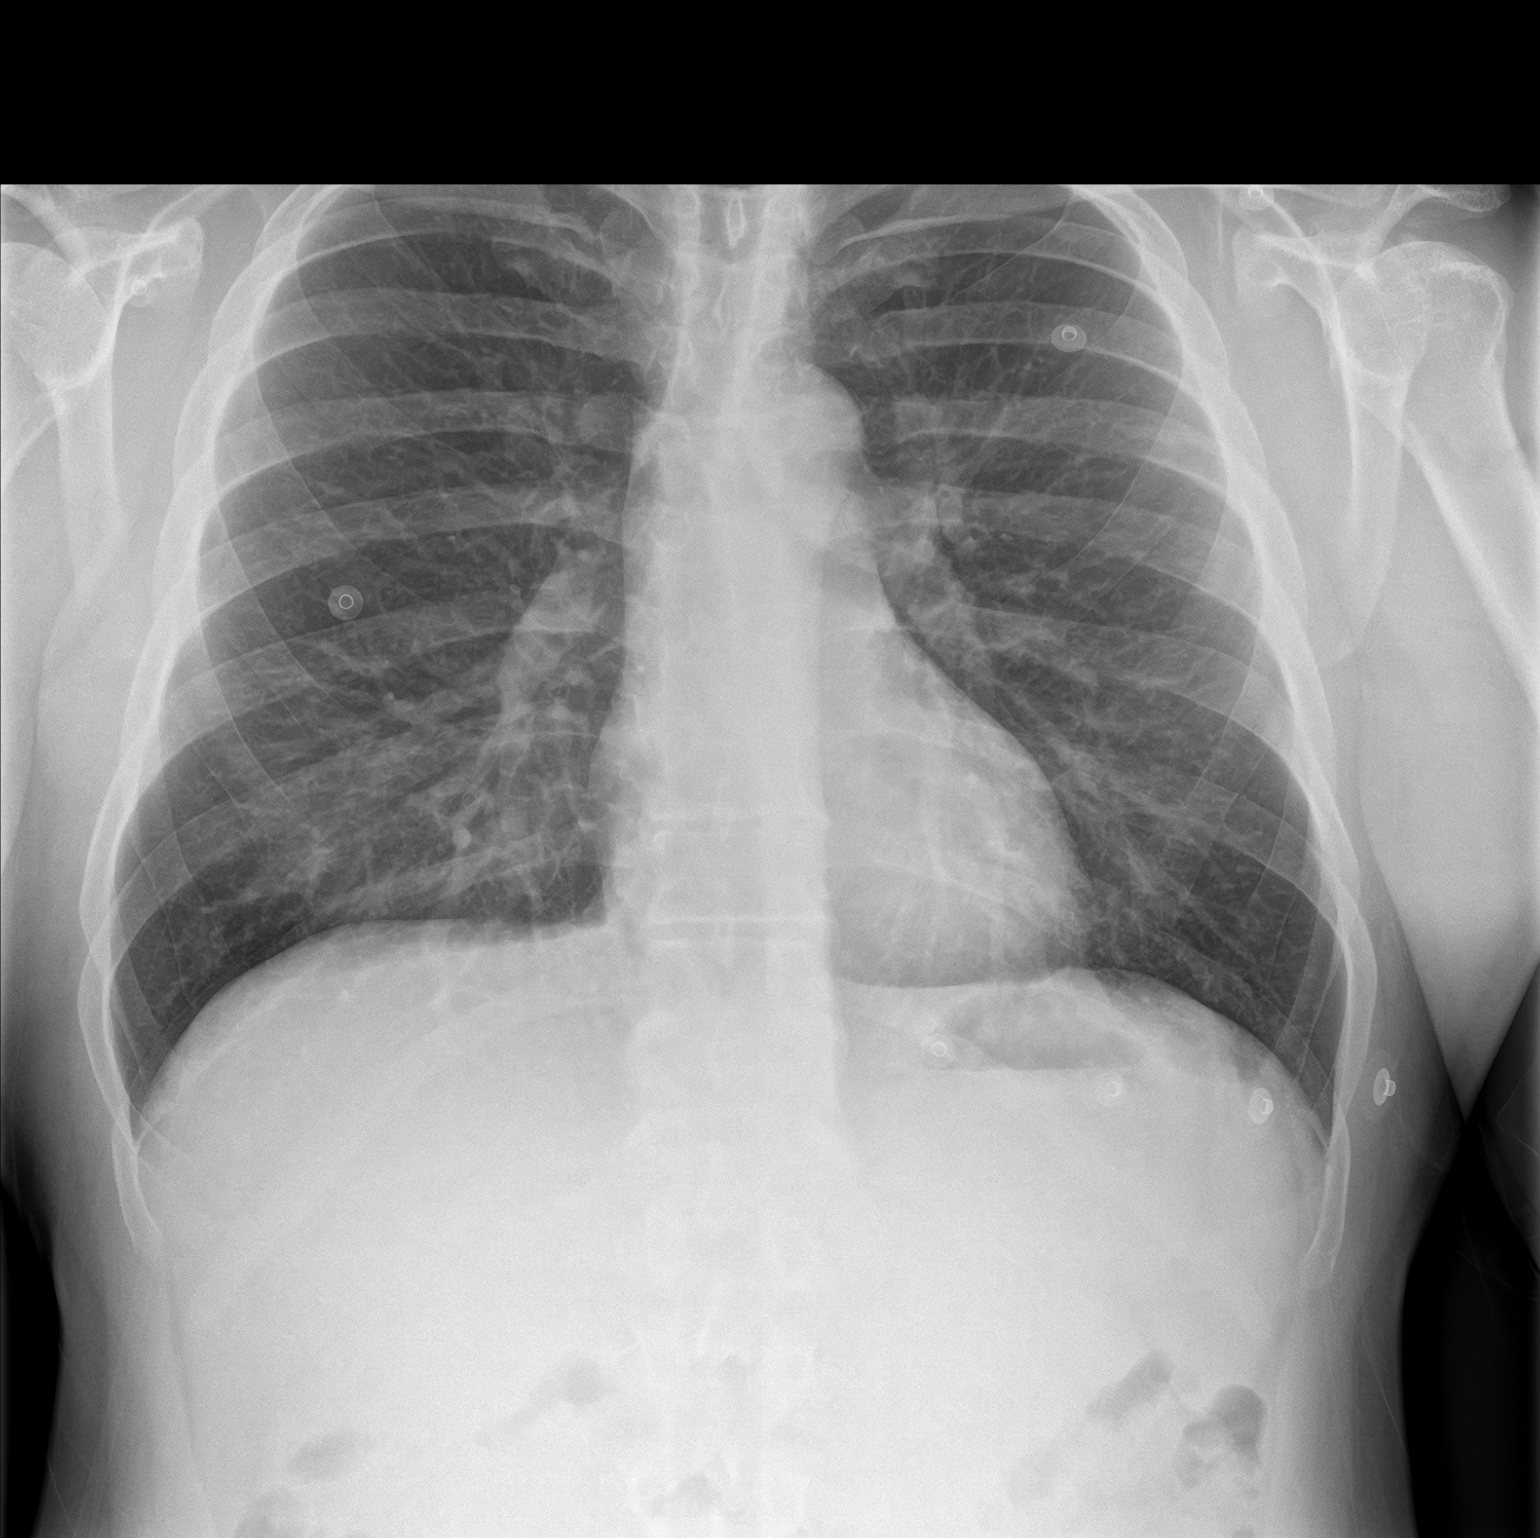
[im 2/2]
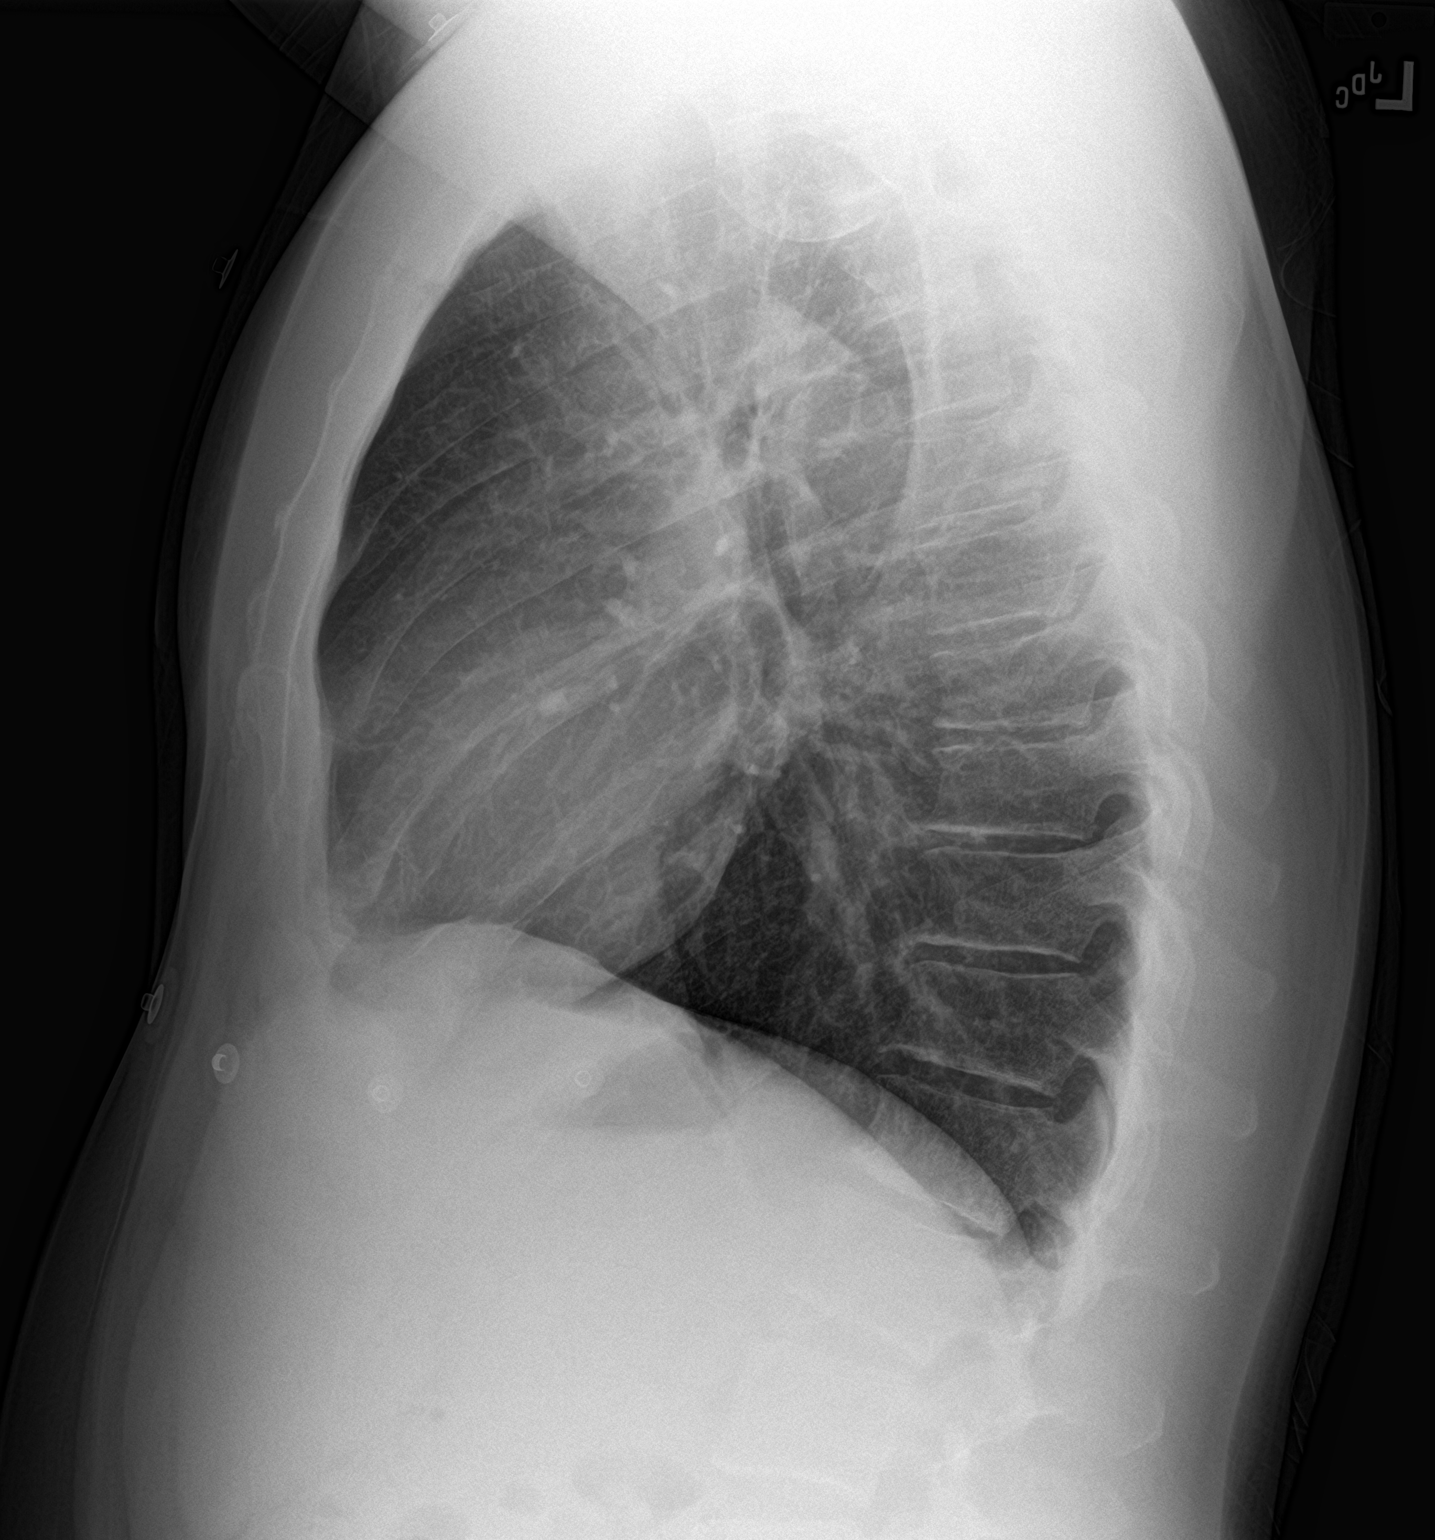

[2 of 2 positions shown; findings below may reference images not displayed]

FINDINGS: The heart size and mediastinal contours are within normal limits.
There is no evidence of pulmonary edema, consolidation,
pneumothorax, nodule or pleural fluid. The visualized skeletal
structures are unremarkable.
IMPRESSION: No active cardiopulmonary disease.

## 2022-06-22 ENCOUNTER — Other Ambulatory Visit: Payer: Self-pay

## 2022-06-22 MED ORDER — PREDNISONE 10 MG PO TABS
ORAL_TABLET | ORAL | 0 refills | Status: AC
  Filled 2022-06-22: qty 14, 14d supply, fill #0

## 2022-06-22 MED ORDER — PANTOPRAZOLE SODIUM 40 MG PO TBEC
DELAYED_RELEASE_TABLET | ORAL | 3 refills | Status: AC
  Filled 2022-06-22: qty 90, 90d supply, fill #0
  Filled 2022-11-10: qty 90, 90d supply, fill #1

## 2022-06-22 MED ORDER — GABAPENTIN 300 MG PO CAPS
300.0000 mg | ORAL_CAPSULE | Freq: Every day | ORAL | 3 refills | Status: AC
  Filled 2022-06-22: qty 90, 90d supply, fill #0

## 2022-09-25 ENCOUNTER — Other Ambulatory Visit: Payer: Self-pay | Admitting: Internal Medicine

## 2022-09-25 ENCOUNTER — Other Ambulatory Visit: Payer: Self-pay

## 2022-09-25 DIAGNOSIS — G9520 Unspecified cord compression: Secondary | ICD-10-CM

## 2022-09-25 DIAGNOSIS — Z87821 Personal history of retained foreign body fully removed: Secondary | ICD-10-CM | POA: Diagnosis not present

## 2022-09-25 DIAGNOSIS — Z01818 Encounter for other preprocedural examination: Secondary | ICD-10-CM | POA: Diagnosis not present

## 2022-09-25 DIAGNOSIS — M5010 Cervical disc disorder with radiculopathy, unspecified cervical region: Secondary | ICD-10-CM | POA: Diagnosis not present

## 2022-09-25 DIAGNOSIS — E538 Deficiency of other specified B group vitamins: Secondary | ICD-10-CM | POA: Diagnosis not present

## 2022-09-25 MED ORDER — PREDNISONE 20 MG PO TABS
ORAL_TABLET | ORAL | 0 refills | Status: AC
  Filled 2022-09-25: qty 14, 10d supply, fill #0

## 2022-09-25 MED ORDER — GABAPENTIN 100 MG PO CAPS
200.0000 mg | ORAL_CAPSULE | Freq: Every day | ORAL | 11 refills | Status: AC
  Filled 2022-09-25: qty 60, 30d supply, fill #0

## 2022-09-25 MED ORDER — ETODOLAC 400 MG PO TABS
400.0000 mg | ORAL_TABLET | Freq: Two times a day (BID) | ORAL | 11 refills | Status: AC
  Filled 2022-09-25: qty 60, 30d supply, fill #0
  Filled 2022-11-10: qty 60, 30d supply, fill #1

## 2022-09-26 ENCOUNTER — Ambulatory Visit
Admission: RE | Admit: 2022-09-26 | Discharge: 2022-09-26 | Disposition: A | Discharge status: Home / Self Care | Payer: Medicaid Other | Source: Ambulatory Visit | Attending: Internal Medicine | Admitting: Internal Medicine

## 2022-09-26 DIAGNOSIS — G9520 Unspecified cord compression: Secondary | ICD-10-CM | POA: Insufficient documentation

## 2022-10-06 DIAGNOSIS — G959 Disease of spinal cord, unspecified: Secondary | ICD-10-CM | POA: Diagnosis not present

## 2022-10-06 DIAGNOSIS — M5010 Cervical disc disorder with radiculopathy, unspecified cervical region: Secondary | ICD-10-CM | POA: Diagnosis not present

## 2022-10-07 ENCOUNTER — Other Ambulatory Visit: Payer: Self-pay

## 2022-10-07 DIAGNOSIS — M542 Cervicalgia: Secondary | ICD-10-CM | POA: Diagnosis not present

## 2022-10-07 DIAGNOSIS — M5010 Cervical disc disorder with radiculopathy, unspecified cervical region: Secondary | ICD-10-CM | POA: Diagnosis not present

## 2022-10-07 DIAGNOSIS — Z6827 Body mass index (BMI) 27.0-27.9, adult: Secondary | ICD-10-CM | POA: Diagnosis not present

## 2022-10-07 MED ORDER — HYDROCODONE-ACETAMINOPHEN 5-325 MG PO TABS
1.0000 | ORAL_TABLET | ORAL | 0 refills | Status: DC | PRN
  Filled 2022-10-07: qty 50, 5d supply, fill #0

## 2022-10-23 ENCOUNTER — Other Ambulatory Visit: Payer: Self-pay

## 2022-10-25 ENCOUNTER — Other Ambulatory Visit: Payer: Self-pay

## 2022-10-25 MED ORDER — HYDROCODONE-ACETAMINOPHEN 5-325 MG PO TABS
1.0000 | ORAL_TABLET | ORAL | 0 refills | Status: AC | PRN
  Filled 2022-10-25: qty 50, 5d supply, fill #0

## 2022-10-26 ENCOUNTER — Other Ambulatory Visit: Payer: Self-pay

## 2022-11-03 DIAGNOSIS — M542 Cervicalgia: Secondary | ICD-10-CM | POA: Diagnosis not present

## 2022-11-03 DIAGNOSIS — M50122 Cervical disc disorder at C5-C6 level with radiculopathy: Secondary | ICD-10-CM | POA: Diagnosis not present

## 2022-11-03 DIAGNOSIS — M5412 Radiculopathy, cervical region: Secondary | ICD-10-CM | POA: Diagnosis not present

## 2022-11-10 ENCOUNTER — Other Ambulatory Visit: Payer: Self-pay

## 2022-11-11 ENCOUNTER — Other Ambulatory Visit: Payer: Self-pay

## 2022-11-11 MED ORDER — HYDROCODONE-ACETAMINOPHEN 5-325 MG PO TABS
1.0000 | ORAL_TABLET | ORAL | 0 refills | Status: DC | PRN
  Filled 2022-11-11: qty 50, 5d supply, fill #0

## 2022-11-12 ENCOUNTER — Other Ambulatory Visit: Payer: Self-pay

## 2022-11-12 DIAGNOSIS — M5010 Cervical disc disorder with radiculopathy, unspecified cervical region: Secondary | ICD-10-CM | POA: Diagnosis not present

## 2022-11-30 ENCOUNTER — Other Ambulatory Visit: Payer: Self-pay

## 2022-11-30 DIAGNOSIS — M47892 Other spondylosis, cervical region: Secondary | ICD-10-CM | POA: Diagnosis not present

## 2022-11-30 DIAGNOSIS — M5412 Radiculopathy, cervical region: Secondary | ICD-10-CM | POA: Diagnosis not present

## 2022-11-30 DIAGNOSIS — M50122 Cervical disc disorder at C5-C6 level with radiculopathy: Secondary | ICD-10-CM | POA: Diagnosis not present

## 2022-11-30 MED ORDER — OXYCODONE-ACETAMINOPHEN 5-325 MG PO TABS
1.0000 | ORAL_TABLET | ORAL | 0 refills | Status: DC | PRN
  Filled 2022-11-30: qty 30, 5d supply, fill #0

## 2022-11-30 MED ORDER — CYCLOBENZAPRINE HCL 10 MG PO TABS
10.0000 mg | ORAL_TABLET | Freq: Three times a day (TID) | ORAL | 0 refills | Status: DC | PRN
  Filled 2022-11-30: qty 30, 10d supply, fill #0

## 2022-12-22 ENCOUNTER — Other Ambulatory Visit: Payer: Self-pay

## 2022-12-22 DIAGNOSIS — M5010 Cervical disc disorder with radiculopathy, unspecified cervical region: Secondary | ICD-10-CM | POA: Diagnosis not present

## 2022-12-22 MED ORDER — HYDROCODONE-ACETAMINOPHEN 5-325 MG PO TABS
1.0000 | ORAL_TABLET | Freq: Four times a day (QID) | ORAL | 0 refills | Status: DC | PRN
  Filled 2022-12-22: qty 30, 4d supply, fill #0

## 2022-12-23 ENCOUNTER — Other Ambulatory Visit: Payer: Self-pay

## 2022-12-23 DIAGNOSIS — M5090 Cervical disc disorder, unspecified, unspecified cervical region: Secondary | ICD-10-CM | POA: Diagnosis not present

## 2022-12-23 DIAGNOSIS — N410 Acute prostatitis: Secondary | ICD-10-CM | POA: Diagnosis not present

## 2022-12-23 MED ORDER — TAMSULOSIN HCL 0.4 MG PO CAPS
0.4000 mg | ORAL_CAPSULE | Freq: Every day | ORAL | 1 refills | Status: AC
  Filled 2022-12-23: qty 30, 30d supply, fill #0

## 2022-12-23 MED ORDER — CIPROFLOXACIN HCL 500 MG PO TABS
500.0000 mg | ORAL_TABLET | Freq: Two times a day (BID) | ORAL | 0 refills | Status: AC
  Filled 2022-12-23: qty 28, 14d supply, fill #0

## 2023-01-09 DIAGNOSIS — H5704 Mydriasis: Secondary | ICD-10-CM | POA: Diagnosis not present

## 2023-01-09 DIAGNOSIS — H209 Unspecified iridocyclitis: Secondary | ICD-10-CM | POA: Diagnosis not present

## 2023-01-09 DIAGNOSIS — S0592XA Unspecified injury of left eye and orbit, initial encounter: Secondary | ICD-10-CM | POA: Diagnosis not present

## 2023-01-09 DIAGNOSIS — S022XXA Fracture of nasal bones, initial encounter for closed fracture: Secondary | ICD-10-CM | POA: Diagnosis not present

## 2023-01-09 DIAGNOSIS — X58XXXA Exposure to other specified factors, initial encounter: Secondary | ICD-10-CM | POA: Diagnosis not present

## 2023-01-09 DIAGNOSIS — Z87891 Personal history of nicotine dependence: Secondary | ICD-10-CM | POA: Diagnosis not present

## 2023-01-09 DIAGNOSIS — S0502XD Injury of conjunctiva and corneal abrasion without foreign body, left eye, subsequent encounter: Secondary | ICD-10-CM | POA: Diagnosis not present

## 2023-01-09 DIAGNOSIS — H2102 Hyphema, left eye: Secondary | ICD-10-CM | POA: Diagnosis not present

## 2023-01-11 ENCOUNTER — Other Ambulatory Visit: Payer: Self-pay

## 2023-01-11 DIAGNOSIS — S0502XA Injury of conjunctiva and corneal abrasion without foreign body, left eye, initial encounter: Secondary | ICD-10-CM | POA: Diagnosis not present

## 2023-01-11 DIAGNOSIS — H2102 Hyphema, left eye: Secondary | ICD-10-CM | POA: Diagnosis not present

## 2023-01-11 DIAGNOSIS — H21233 Degeneration of iris (pigmentary), bilateral: Secondary | ICD-10-CM | POA: Diagnosis not present

## 2023-01-11 MED ORDER — LATANOPROST 0.005 % OP SOLN
1.0000 [drp] | Freq: Every day | OPHTHALMIC | 0 refills | Status: AC
  Filled 2023-01-11: qty 2.5, 50d supply, fill #0

## 2023-01-12 DIAGNOSIS — H2102 Hyphema, left eye: Secondary | ICD-10-CM | POA: Diagnosis not present

## 2023-01-12 DIAGNOSIS — H5704 Mydriasis: Secondary | ICD-10-CM | POA: Diagnosis not present

## 2023-01-14 DIAGNOSIS — R55 Syncope and collapse: Secondary | ICD-10-CM | POA: Diagnosis not present

## 2023-01-14 DIAGNOSIS — S199XXA Unspecified injury of neck, initial encounter: Secondary | ICD-10-CM | POA: Diagnosis not present

## 2023-01-14 DIAGNOSIS — Z87891 Personal history of nicotine dependence: Secondary | ICD-10-CM | POA: Diagnosis not present

## 2023-01-14 DIAGNOSIS — S3993XA Unspecified injury of pelvis, initial encounter: Secondary | ICD-10-CM | POA: Diagnosis not present

## 2023-01-14 DIAGNOSIS — S6291XA Unspecified fracture of right wrist and hand, initial encounter for closed fracture: Secondary | ICD-10-CM | POA: Diagnosis not present

## 2023-01-14 DIAGNOSIS — S62339A Displaced fracture of neck of unspecified metacarpal bone, initial encounter for closed fracture: Secondary | ICD-10-CM | POA: Diagnosis not present

## 2023-01-14 DIAGNOSIS — K589 Irritable bowel syndrome without diarrhea: Secondary | ICD-10-CM | POA: Diagnosis not present

## 2023-01-14 DIAGNOSIS — S3992XA Unspecified injury of lower back, initial encounter: Secondary | ICD-10-CM | POA: Diagnosis not present

## 2023-01-14 DIAGNOSIS — M7989 Other specified soft tissue disorders: Secondary | ICD-10-CM | POA: Diagnosis not present

## 2023-01-14 DIAGNOSIS — S0990XA Unspecified injury of head, initial encounter: Secondary | ICD-10-CM | POA: Diagnosis not present

## 2023-01-14 DIAGNOSIS — S3991XA Unspecified injury of abdomen, initial encounter: Secondary | ICD-10-CM | POA: Diagnosis not present

## 2023-01-14 DIAGNOSIS — S299XXA Unspecified injury of thorax, initial encounter: Secondary | ICD-10-CM | POA: Diagnosis not present

## 2023-01-14 DIAGNOSIS — S61411A Laceration without foreign body of right hand, initial encounter: Secondary | ICD-10-CM | POA: Diagnosis not present

## 2023-01-19 DIAGNOSIS — H5704 Mydriasis: Secondary | ICD-10-CM | POA: Diagnosis not present

## 2023-07-25 ENCOUNTER — Other Ambulatory Visit: Payer: Self-pay

## 2023-07-26 ENCOUNTER — Other Ambulatory Visit: Payer: Self-pay

## 2023-07-27 ENCOUNTER — Other Ambulatory Visit: Payer: Self-pay

## 2023-07-29 ENCOUNTER — Other Ambulatory Visit: Payer: Self-pay

## 2023-07-30 ENCOUNTER — Other Ambulatory Visit: Payer: Self-pay

## 2023-07-30 MED ORDER — PANTOPRAZOLE SODIUM 40 MG PO TBEC
40.0000 mg | DELAYED_RELEASE_TABLET | Freq: Every day | ORAL | 11 refills | Status: DC
  Filled 2023-07-30: qty 30, 30d supply, fill #0

## 2024-06-01 ENCOUNTER — Other Ambulatory Visit: Payer: Self-pay | Admitting: Family Medicine

## 2024-06-01 ENCOUNTER — Ambulatory Visit
Admission: RE | Admit: 2024-06-01 | Discharge: 2024-06-01 | Disposition: A | Discharge status: Home / Self Care | Source: Ambulatory Visit | Attending: Family Medicine | Admitting: Family Medicine

## 2024-06-01 DIAGNOSIS — S060X1A Concussion with loss of consciousness of 30 minutes or less, initial encounter: Secondary | ICD-10-CM | POA: Insufficient documentation

## 2024-06-01 DIAGNOSIS — S0990XA Unspecified injury of head, initial encounter: Secondary | ICD-10-CM | POA: Insufficient documentation

## 2024-06-01 DIAGNOSIS — M5412 Radiculopathy, cervical region: Secondary | ICD-10-CM | POA: Insufficient documentation

## 2024-06-15 ENCOUNTER — Other Ambulatory Visit: Payer: Self-pay

## 2024-06-15 MED ORDER — AMPHETAMINE-DEXTROAMPHETAMINE 5 MG PO TABS
5.0000 mg | ORAL_TABLET | Freq: Two times a day (BID) | ORAL | 0 refills | Status: DC
  Filled 2024-06-15: qty 60, 30d supply, fill #0

## 2024-06-15 MED ORDER — PREDNISONE 20 MG PO TABS
ORAL_TABLET | ORAL | 0 refills | Status: DC
  Filled 2024-06-15: qty 12, 8d supply, fill #0

## 2024-06-22 ENCOUNTER — Other Ambulatory Visit: Payer: Self-pay

## 2024-06-22 MED ORDER — AMITRIPTYLINE HCL 10 MG PO TABS
10.0000 mg | ORAL_TABLET | Freq: Every day | ORAL | 11 refills | Status: DC
  Filled 2024-06-22: qty 30, 30d supply, fill #0

## 2024-06-22 MED ORDER — DICLOFENAC SODIUM 75 MG PO TBEC
75.0000 mg | DELAYED_RELEASE_TABLET | Freq: Two times a day (BID) | ORAL | 11 refills | Status: AC
Start: 2024-06-22 — End: ?
  Filled 2024-06-22: qty 60, 30d supply, fill #0

## 2024-06-23 ENCOUNTER — Other Ambulatory Visit: Payer: Self-pay

## 2024-06-29 ENCOUNTER — Other Ambulatory Visit: Payer: Self-pay

## 2024-06-29 MED ORDER — DIAZEPAM 10 MG PO TABS
10.0000 mg | ORAL_TABLET | Freq: Every day | ORAL | 0 refills | Status: AC
  Filled 2024-06-29: qty 30, 30d supply, fill #0

## 2024-07-24 ENCOUNTER — Other Ambulatory Visit: Payer: Self-pay

## 2024-07-24 MED ORDER — AMPHETAMINE-DEXTROAMPHETAMINE 5 MG PO TABS
5.0000 mg | ORAL_TABLET | Freq: Two times a day (BID) | ORAL | 0 refills | Status: AC
  Filled 2024-07-24: qty 60, 30d supply, fill #0

## 2024-07-31 ENCOUNTER — Other Ambulatory Visit: Payer: Self-pay

## 2024-07-31 MED ORDER — DICLOFENAC SODIUM 75 MG PO TBEC
75.0000 mg | DELAYED_RELEASE_TABLET | Freq: Two times a day (BID) | ORAL | 11 refills | Status: AC
  Filled 2024-07-31: qty 60, 30d supply, fill #0

## 2024-07-31 MED ORDER — DIAZEPAM 5 MG PO TABS
5.0000 mg | ORAL_TABLET | Freq: Every day | ORAL | 1 refills | Status: DC | PRN
  Filled 2024-07-31: qty 30, 30d supply, fill #0

## 2024-07-31 MED ORDER — AMPHETAMINE-DEXTROAMPHETAMINE 5 MG PO TABS
5.0000 mg | ORAL_TABLET | Freq: Two times a day (BID) | ORAL | 0 refills | Status: AC
  Filled 2024-07-31: qty 60, 30d supply, fill #0

## 2024-08-30 ENCOUNTER — Other Ambulatory Visit: Payer: Self-pay | Admitting: Internal Medicine

## 2024-08-30 DIAGNOSIS — G959 Disease of spinal cord, unspecified: Secondary | ICD-10-CM

## 2024-09-04 ENCOUNTER — Ambulatory Visit
Admission: RE | Admit: 2024-09-04 | Discharge: 2024-09-04 | Disposition: A | Discharge status: Home / Self Care | Source: Ambulatory Visit | Attending: Internal Medicine | Admitting: Internal Medicine

## 2024-09-04 DIAGNOSIS — G959 Disease of spinal cord, unspecified: Secondary | ICD-10-CM | POA: Insufficient documentation

## 2024-09-11 ENCOUNTER — Other Ambulatory Visit: Payer: Self-pay

## 2024-09-11 MED ORDER — PANTOPRAZOLE SODIUM 40 MG PO TBEC
40.0000 mg | DELAYED_RELEASE_TABLET | Freq: Every day | ORAL | 3 refills | Status: AC
  Filled 2024-09-11: qty 90, 90d supply, fill #0

## 2024-09-11 MED ORDER — TRAMADOL HCL 50 MG PO TABS
50.0000 mg | ORAL_TABLET | Freq: Two times a day (BID) | ORAL | 1 refills | Status: AC
  Filled 2024-09-11: qty 60, 30d supply, fill #0

## 2024-09-19 ENCOUNTER — Emergency Department
Admission: EM | Admit: 2024-09-19 | Discharge: 2024-09-19 | Disposition: A | Discharge status: Home / Self Care | Attending: Emergency Medicine | Admitting: Emergency Medicine

## 2024-09-19 ENCOUNTER — Other Ambulatory Visit: Payer: Self-pay

## 2024-09-19 DIAGNOSIS — M545 Low back pain, unspecified: Secondary | ICD-10-CM | POA: Insufficient documentation

## 2024-09-19 DIAGNOSIS — M542 Cervicalgia: Secondary | ICD-10-CM | POA: Diagnosis present

## 2024-09-19 DIAGNOSIS — M7918 Myalgia, other site: Secondary | ICD-10-CM

## 2024-09-19 NOTE — ED Notes (Signed)
 See triage note  Presents with neck pain   States increased pain with turning  States he fell a few months ago

## 2024-09-19 NOTE — ED Triage Notes (Signed)
 Pt reports he fell a few months ago at work, pt has had neck and back pain since. Pt reports his dr gave him stretches to help, pt reports he was stretching this morning and over did it and not is having neck pain and unable to turn head to left. Pain is worse back.

## 2024-09-19 NOTE — ED Provider Notes (Signed)
 "  [facility] Provider Note    Event Date/Time   First MD Initiated Contact with Patient 09/19/24 [phone]     (approximate)   History   Fall and Torticollis (/)   HPI  Arthur Gonzalez is a 44 y.o. male   presents to the ED with complaint of neck and low back pain.  Patient states that he fell in October and has had x-rays and an MRI done.  He states that he has been taking medication and his PCP gave him some stretches to do to help with his continued pain.  He did these stretches this morning and states that he has had increased pain in his muscles and that he is not getting better.  Patient reports that he has been on steroids, anti-inflammatory, muscle relaxant and tramadol .  He also reports he has an appointment with Dr. Oneil Pinal tomorrow morning but cannot wait because of his increased pain.  He also spoken to his neurosurgeon who is already told him on the phone that there is no surgical procedure to be done after looking at his MRI and that he also has a follow-up appointment with the neurosurgeon in [place].      Physical Exam   Triage Vital Signs: ED Triage Vitals [09/19/24 0644]  Encounter Vitals Group     BP (!) 157/88     Girls Systolic BP Percentile      Girls Diastolic BP Percentile      Boys Systolic BP Percentile      Boys Diastolic BP Percentile      Pulse Rate (!) 120     Resp 17     Temp 97.8 F (36.6 C)     Temp src      SpO2 100 %     Weight 180 lb (81.6 kg)     Height 5' 9 (1.753 m)     Head Circumference      Peak Flow      Pain Score 9     Pain Loc      Pain Education      Exclude from Growth Chart     Most recent vital signs: Vitals:   09/19/24 0644  BP: (!) 157/88  Pulse: (!) 120  Resp: 17  Temp: 97.8 F (36.6 C)  SpO2: 100%     General: Awake, no distress.  CV:  Good peripheral perfusion.  Resp:  Normal effort.  Abd:  No distention.  Other:  Generalized tenderness on palpation of cervical spine  posteriorly.  Patient also has tenderness on palpation of the lower lumbar area and involving the paravertebral muscles bilaterally.  Good muscle strength bilaterally lower extremities and with exertion.  Patient is able to stand and ambulate without any assistance.  Moving upper extremities without any difficulty.   ED Results / Procedures / Treatments   Labs (all labs ordered are listed, but only abnormal results are displayed) Labs Reviewed - No data to display    PROCEDURES:  Critical Care performed:   Procedures   MEDICATIONS ORDERED IN ED: Medications - No data to display   IMPRESSION / MDM / ASSESSMENT AND PLAN / ED COURSE  I reviewed the triage vital signs and the nursing notes.   Differential diagnosis includes, but is not limited to, chronic musculoskeletal pain, strain, consider fibromyalgia  44 year old male presents to the ED with complaint of muscle skeletal pain since October.  Patient has an appointment with Dr. Oneil Pinal tomorrow.  He reports that he has diclofenac  at the pharmacy to pick up, and tramadol  which she has not taken on a regular basis.  Patient was offered a steroid injection until he can be reevaluated by Dr. Cleotilde tomorrow.  He opted not to have a steroid injection as he states he has already been on steroids.  I reviewed his MRI results and explained them to him.  Patient remained disgruntled as he wanted an absolute answer as to why he was hurting today.  As noted in Dr. Dianne office notes there is a referral being made to Dr. Avanell for consideration of an epidural for his continued low back pain.  I encouraged him to keep his appoint with Dr. Cleotilde to discuss this option tomorrow.  Patient left disgruntled as we were not getting plain x-rays today.        Patient's presentation is most consistent with exacerbation of chronic illness.  FINAL CLINICAL IMPRESSION(S) / ED DIAGNOSES   Final diagnoses:  Cervical spine pain  Musculoskeletal  pain     Rx / DC Orders   ED Discharge Orders     None        Note:  This document was prepared using Dragon voice recognition software and may include unintentional dictation errors.   Saunders Shona CROME, PA-C 09/19/24 1549  "

## 2024-09-19 NOTE — Discharge Instructions (Signed)
 Keep your appointment with Dr. Cleotilde tomorrow

## 2024-09-19 NOTE — ED Notes (Signed)
 States he does not understand why no one will fix this problem  Has appt with his PCP tomorrow

## 2024-09-20 ENCOUNTER — Other Ambulatory Visit: Payer: Self-pay

## 2024-09-20 MED ORDER — METHOCARBAMOL 750 MG PO TABS
375.0000 mg | ORAL_TABLET | Freq: Three times a day (TID) | ORAL | 0 refills | Status: AC | PRN
  Filled 2024-09-20: qty 90, 30d supply, fill #0

## 2024-09-26 ENCOUNTER — Other Ambulatory Visit: Payer: Self-pay | Admitting: Family Medicine

## 2024-09-26 DIAGNOSIS — S32010K Wedge compression fracture of first lumbar vertebra, subsequent encounter for fracture with nonunion: Secondary | ICD-10-CM

## 2024-09-26 DIAGNOSIS — S22080K Wedge compression fracture of T11-T12 vertebra, subsequent encounter for fracture with nonunion: Secondary | ICD-10-CM

## 2024-09-27 ENCOUNTER — Encounter: Payer: Self-pay | Admitting: Family Medicine

## 2024-09-30 ENCOUNTER — Inpatient Hospital Stay: Admission: RE | Admit: 2024-09-30

## 2024-09-30 ENCOUNTER — Other Ambulatory Visit
